# Patient Record
Sex: Female | Born: 1970
Health system: Southern US, Community
[De-identification: ages and names within clinical notes are randomized; demographics above are authoritative.]

## PROBLEM LIST (undated history)

## (undated) DIAGNOSIS — E079 Disorder of thyroid, unspecified: Secondary | ICD-10-CM

## (undated) DIAGNOSIS — D649 Anemia, unspecified: Secondary | ICD-10-CM

## (undated) HISTORY — PX: WISDOM TOOTH EXTRACTION: SHX21

## (undated) HISTORY — DX: Anemia, unspecified: D64.9

## (undated) HISTORY — DX: Disorder of thyroid, unspecified: E07.9

---

## 2001-11-11 ENCOUNTER — Other Ambulatory Visit: Admission: RE | Admit: 2001-11-11 | Discharge: 2001-11-11 | Payer: Self-pay | Admitting: *Deleted

## 2004-05-17 ENCOUNTER — Other Ambulatory Visit: Admission: RE | Admit: 2004-05-17 | Discharge: 2004-05-17 | Payer: Self-pay | Admitting: Obstetrics and Gynecology

## 2004-08-14 ENCOUNTER — Ambulatory Visit: Admission: RE | Admit: 2004-08-14 | Discharge: 2004-08-14 | Payer: Self-pay | Admitting: Internal Medicine

## 2004-08-29 ENCOUNTER — Ambulatory Visit: Payer: Self-pay | Admitting: Critical Care Medicine

## 2005-06-01 ENCOUNTER — Other Ambulatory Visit: Admission: RE | Admit: 2005-06-01 | Discharge: 2005-06-01 | Payer: Self-pay | Admitting: Obstetrics and Gynecology

## 2006-03-15 ENCOUNTER — Inpatient Hospital Stay (HOSPITAL_COMMUNITY): Admission: AD | Admit: 2006-03-15 | Discharge: 2006-03-17 | Payer: Self-pay | Admitting: Obstetrics and Gynecology

## 2006-03-18 ENCOUNTER — Encounter: Admission: RE | Admit: 2006-03-18 | Discharge: 2006-04-17 | Payer: Self-pay | Admitting: Obstetrics and Gynecology

## 2006-04-18 ENCOUNTER — Encounter: Admission: RE | Admit: 2006-04-18 | Discharge: 2006-05-17 | Payer: Self-pay | Admitting: Obstetrics and Gynecology

## 2010-10-18 ENCOUNTER — Inpatient Hospital Stay (HOSPITAL_COMMUNITY)
Admission: AD | Admit: 2010-10-18 | Discharge: 2010-10-20 | Payer: Self-pay | Source: Home / Self Care | Attending: Obstetrics and Gynecology | Admitting: Obstetrics and Gynecology

## 2010-10-20 ENCOUNTER — Encounter
Admission: RE | Admit: 2010-10-20 | Discharge: 2010-11-19 | Payer: Self-pay | Source: Home / Self Care | Attending: Obstetrics and Gynecology | Admitting: Obstetrics and Gynecology

## 2010-11-20 ENCOUNTER — Encounter
Admission: RE | Admit: 2010-11-20 | Discharge: 2010-11-21 | Payer: Self-pay | Source: Home / Self Care | Attending: Obstetrics and Gynecology | Admitting: Obstetrics and Gynecology

## 2010-12-21 ENCOUNTER — Encounter (HOSPITAL_COMMUNITY)
Admission: RE | Admit: 2010-12-21 | Discharge: 2010-12-21 | Disposition: A | Discharge status: Home / Self Care | Payer: 59 | Source: Ambulatory Visit | Attending: Obstetrics and Gynecology | Admitting: Obstetrics and Gynecology

## 2010-12-21 DIAGNOSIS — O923 Agalactia: Secondary | ICD-10-CM | POA: Insufficient documentation

## 2011-01-01 LAB — CBC
HCT: 30.7 % — ABNORMAL LOW (ref 36.0–46.0)
HCT: 32.6 % — ABNORMAL LOW (ref 36.0–46.0)
HCT: 38.6 % (ref 36.0–46.0)
Hemoglobin: 10.6 g/dL — ABNORMAL LOW (ref 12.0–15.0)
Hemoglobin: 13.1 g/dL (ref 12.0–15.0)
MCH: 28.7 pg (ref 26.0–34.0)
MCH: 28.8 pg (ref 26.0–34.0)
MCHC: 32.5 g/dL (ref 30.0–36.0)
MCHC: 33.9 g/dL (ref 30.0–36.0)
MCV: 87.3 fL (ref 78.0–100.0)
MCV: 88.3 fL (ref 78.0–100.0)
MCV: 88.5 fL (ref 78.0–100.0)
Platelets: 126 10*3/uL — ABNORMAL LOW (ref 150–400)
RBC: 3.47 MIL/uL — ABNORMAL LOW (ref 3.87–5.11)
RDW: 13.2 % (ref 11.5–15.5)
RDW: 13.4 % (ref 11.5–15.5)
WBC: 10.4 10*3/uL (ref 4.0–10.5)
WBC: 11 10*3/uL — ABNORMAL HIGH (ref 4.0–10.5)

## 2011-01-21 ENCOUNTER — Encounter (HOSPITAL_COMMUNITY)
Admission: RE | Admit: 2011-01-21 | Discharge: 2011-01-21 | Disposition: A | Discharge status: Home / Self Care | Payer: 59 | Source: Ambulatory Visit | Attending: Obstetrics and Gynecology | Admitting: Obstetrics and Gynecology

## 2011-01-21 DIAGNOSIS — O923 Agalactia: Secondary | ICD-10-CM | POA: Insufficient documentation

## 2013-03-03 ENCOUNTER — Ambulatory Visit: Payer: Self-pay | Admitting: Family Medicine

## 2013-03-03 ENCOUNTER — Ambulatory Visit: Payer: 59

## 2013-03-03 DIAGNOSIS — M542 Cervicalgia: Secondary | ICD-10-CM

## 2013-03-03 DIAGNOSIS — S139XXA Sprain of joints and ligaments of unspecified parts of neck, initial encounter: Secondary | ICD-10-CM

## 2013-03-03 DIAGNOSIS — S161XXA Strain of muscle, fascia and tendon at neck level, initial encounter: Secondary | ICD-10-CM

## 2013-03-03 MED ORDER — MELOXICAM 15 MG PO TABS: 15.0000 mg | ORAL_TABLET | Freq: Every day | ORAL | Status: DC

## 2013-03-03 MED ORDER — CYCLOBENZAPRINE HCL 10 MG PO TABS: 10.0000 mg | ORAL_TABLET | Freq: Three times a day (TID) | ORAL | Status: DC | PRN
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
## 2013-03-03 NOTE — Patient Instructions (Addendum)
Begin taking the Mobic (meloxicam) once daily - this will help with pain and inflammation.  Do not take any additional Advil or Aleve with this medicine,  If you need further pain relief, you may use Tylenol.  Use Flexeril (cyclobenzaprine) at bedtime - this will help relax the muscle spasm.  This may make you sleepy, so try at night only at first.  If it is not too sedating, you may use every 8 hours.  Ice or heat to the area may also help relieve discomfort.  If any symptoms are worsening or not improving please let us know.  If the chest pain is worsening please come back in or go to the emergency department   Cervical Strain Care After A cervical strain is when the muscles and ligaments in your neck have been stretched. The bones are not broken. If you had any problems moving your arms or legs immediately after the injury, even if the problem has gone away, make sure to tell this to your caregiver.  HOME CARE INSTRUCTIONS   While awake, apply ice packs to the neck or areas of pain about every 1 to 2 hours, for 15 to 20 minutes at a time. Do this for 2 days. If you were given a cervical collar for support, ask your caregiver if you may remove it for bathing or applying ice.   If given a cervical collar, wear as instructed. Do not remove any collar unless instructed by a caregiver.   Only take over-the-counter or prescription medicines for pain, discomfort, or fever as directed by your caregiver.  Recheck with the hospital or clinic after a radiologist has read your X-rays. Recheck with the hospital or clinic to make sure the initial readings are correct. Do this also to determine if you need further studies. It is your responsibility to find out your X-ray results. X-rays are sometimes repeated in one week to ten days. These are often repeated to make sure that a hairline fracture was not overlooked. Ask your caregiver how you are to find out about your radiology (X-ray) results. SEEK  IMMEDIATE MEDICAL CARE IF:   You have increasing pain in your neck.   You develop difficulties swallowing or breathing.   You have numbness, weakness, or movement problems in the arms or legs.   You have difficulty walking.   You develop bowel or bladder retention or incontinence.   You have problems with walking.  MAKE SURE YOU:   Understand these instructions.   Will watch your condition.   Will get help right away if you are not doing well or get worse.  Document Released: 10/08/2005 Document Revised: 06/20/2011 Document Reviewed: 05/21/2008 [facility] Patient Information 2012 [place], [place].

## 2013-03-03 NOTE — Progress Notes (Signed)
Xray read and patient discussed with Ms Debbra Riding. Agree with assessment and plan of care per her note.  Xray report:IMPRESSION:  1. No definite acute findings. If concern persists for an occult  acute injury to the cervical spine, further evaluation with  cervical spine CT is recommended.  2. Mild to moderate multilevel cervical spine DDD, worse at C6 - C7.

## 2013-08-27 ENCOUNTER — Other Ambulatory Visit: Payer: Self-pay

## 2015-09-08 ENCOUNTER — Encounter: Payer: Self-pay | Admitting: Physician Assistant

## 2015-09-08 ENCOUNTER — Ambulatory Visit: Payer: Self-pay | Admitting: Family

## 2015-09-08 VITALS — BP 100/70 | HR 87 | Temp 98.1°F

## 2015-09-08 DIAGNOSIS — R05 Cough: Secondary | ICD-10-CM

## 2015-09-08 DIAGNOSIS — R059 Cough, unspecified: Secondary | ICD-10-CM

## 2015-09-08 DIAGNOSIS — J40 Bronchitis, not specified as acute or chronic: Secondary | ICD-10-CM

## 2015-09-08 MED ORDER — LEVOFLOXACIN 500 MG PO TABS: 500.0000 mg | ORAL_TABLET | Freq: Every day | ORAL | Status: DC

## 2015-09-08 MED ORDER — PREDNISONE 20 MG PO TABS: 40.0000 mg | ORAL_TABLET | Freq: Every day | ORAL | Status: DC

## 2015-09-08 MED ORDER — HYDROCOD POLST-CPM POLST ER 10-8 MG/5ML PO SUER: 5.0000 mL | Freq: Two times a day (BID) | ORAL | Status: DC | PRN

## 2015-09-08 NOTE — Progress Notes (Signed)
S/ cold and cough x one week , tightness in chest , sore in shoulder blade from coughing Denies fever, chills , gi sxs  O/ VSS alert mildly ill  ENT tms retracted, pharnx clear, nasal mucosa red and swollen , neck supple , heart rsr lungs are clear  A bronchitis cough P levaquin 500 mg  , pred pulse, tussionex see order s .supportive measures rest at home. F./u prn not improving .

## 2015-12-23 ENCOUNTER — Encounter: Payer: Self-pay | Admitting: Physician Assistant

## 2015-12-23 ENCOUNTER — Ambulatory Visit: Payer: Self-pay | Admitting: Physician Assistant

## 2015-12-23 VITALS — BP 110/70 | HR 81 | Temp 98.5°F

## 2015-12-23 DIAGNOSIS — E038 Other specified hypothyroidism: Secondary | ICD-10-CM

## 2015-12-23 NOTE — Progress Notes (Signed)
   Subjective:    Patient ID: Aimee Cole, female    DOB: [birth year], 45 y.o.   MRN: [id number]  HPI Patient request prescription for levothyroxine due to elevate TSH. Patient states Family Doctor office told her labs were normal. Patient has copy of Lab results from this clinic showing elevate TSH.   Review of Systems    Maliase and depression Objective   Physical Exam Deferred Will consult to Internal Medicine for definitive evaluation and treatment.      Assessment & Plan:

## 2016-02-14 ENCOUNTER — Ambulatory Visit: Payer: Self-pay | Admitting: Physician Assistant

## 2016-02-14 ENCOUNTER — Encounter: Payer: Self-pay | Admitting: Physician Assistant

## 2016-02-14 VITALS — BP 119/75 | HR 107 | Temp 98.4°F

## 2016-02-14 DIAGNOSIS — J069 Acute upper respiratory infection, unspecified: Secondary | ICD-10-CM

## 2016-02-14 MED ORDER — AMOXICILLIN 875 MG PO TABS: 875.0000 mg | ORAL_TABLET | Freq: Two times a day (BID) | ORAL | Status: DC

## 2016-02-14 NOTE — Progress Notes (Signed)
S: C/o runny nose and congestion for 3 days, no fever, chills, cp/sob, v/d; throat is sore,  cough is sporadic and dry, some body aches   Using otc meds: mucinex  O: PE: vitals wnl, nad, perrl eomi, normocephalic, tms dull, nasal mucosa red and swollen, throat injected, neck supple no lymph, lungs c t a, cv rrr, neuro intact  A:  Acute uri   P: amoxil 875mg  bid x 10d, drink fluids, continue regular meds , use otc meds of choice, return if not improving in 5 days, return earlier if worsening

## 2016-05-08 ENCOUNTER — Telehealth: Payer: Self-pay | Admitting: General Practice

## 2016-05-08 ENCOUNTER — Ambulatory Visit (INDEPENDENT_AMBULATORY_CARE_PROVIDER_SITE_OTHER): Payer: Managed Care, Other (non HMO) | Admitting: Primary Care

## 2016-05-08 ENCOUNTER — Ambulatory Visit: Payer: Self-pay | Admitting: Physician Assistant

## 2016-05-08 ENCOUNTER — Encounter: Payer: Self-pay | Admitting: Primary Care

## 2016-05-08 VITALS — BP 144/92 | HR 87 | Temp 98.1°F | Ht 66.0 in | Wt 190.0 lb

## 2016-05-08 DIAGNOSIS — R42 Dizziness and giddiness: Secondary | ICD-10-CM

## 2016-05-08 DIAGNOSIS — R030 Elevated blood-pressure reading, without diagnosis of hypertension: Secondary | ICD-10-CM | POA: Insufficient documentation

## 2016-05-08 DIAGNOSIS — E039 Hypothyroidism, unspecified: Secondary | ICD-10-CM

## 2016-05-08 DIAGNOSIS — F411 Generalized anxiety disorder: Secondary | ICD-10-CM | POA: Insufficient documentation

## 2016-05-08 LAB — COMPREHENSIVE METABOLIC PANEL
ALBUMIN: 4.2 g/dL (ref 3.5–5.2)
ALK PHOS: 59 U/L (ref 39–117)
ALT: 13 U/L (ref 0–35)
AST: 19 U/L (ref 0–37)
BUN: 12 mg/dL (ref 6–23)
CALCIUM: 9.4 mg/dL (ref 8.4–10.5)
CO2: 30 mEq/L (ref 19–32)
CREATININE: 0.88 mg/dL (ref 0.40–1.20)
Chloride: 105 mEq/L (ref 96–112)
GFR: 74.3 mL/min (ref >60.00)
Glucose, Bld: 104 mg/dL — ABNORMAL HIGH (ref 70–99)
POTASSIUM: 4 meq/L (ref 3.5–5.1)
SODIUM: 139 meq/L (ref 135–145)
TOTAL PROTEIN: 7.2 g/dL (ref 6.0–8.3)
Total Bilirubin: 0.4 mg/dL (ref 0.2–1.2)

## 2016-05-08 LAB — HEMOGLOBIN A1C: HEMOGLOBIN A1C: 4.9 % (ref 4.6–6.5)

## 2016-05-08 MED ORDER — ONDANSETRON 4 MG PO TBDP: 4.0000 mg | ORAL_TABLET | Freq: Three times a day (TID) | ORAL | Status: DC | PRN

## 2016-05-08 MED ORDER — MECLIZINE HCL 25 MG PO TABS: 25.0000 mg | ORAL_TABLET | Freq: Three times a day (TID) | ORAL | Status: DC | PRN

## 2016-05-08 NOTE — Telephone Encounter (Signed)
Pt called  - she is seeing you for a new pt on Thursday. Today she is having dizziness and blurry vision.  Do you want me to schedule her into an acute, have her talk to team health or something different?  cb number is [phone] Thanks

## 2016-05-08 NOTE — Telephone Encounter (Signed)
Pt was made a new pt appt for today at 11:45am due to a canc

## 2016-05-08 NOTE — Assessment & Plan Note (Signed)
Follows with psychiatry, Dr. Nicolasa Ducking. Follow-up appointment scheduled for later this week. Uses clonazepam only as needed for breakthrough panic attacks or anxiety. Overall feels well managed.

## 2016-05-08 NOTE — Assessment & Plan Note (Signed)
Follows with endocrinology, Dr. Della Goo with [facility]. Managed on Armour Thyroid 30 mg and is due for follow-up with endocrinologist today.

## 2016-05-08 NOTE — Assessment & Plan Note (Signed)
No prior history per patient. Suspect elevation due to acute dizziness and headache. Given family history we'll continue to monitor.

## 2016-05-08 NOTE — Progress Notes (Signed)
Subjective:    Patient ID: Aimee Cole, female    DOB: 1971/01/14, 45 y.o.   MRN: [id number]  HPI  Aimee Cole is a 45 year old female who presents today to establish care and discuss the problems mentioned below. Will obtain old records. Her last physical was over 1 year.   1) Elevated Blood Pressure Reading: BP above goal in the clinic today at 144/92. She denies prior history of elevated blood pressure. She's had her BP checked recently which was stable per patient. Her blood pressure typically runs between 110-120/70's. She is here for an acute visit today for dizziness.  2) Hypothyroidism: Diagnosed 2 years ago. Currently managed on Amrour Thyroid 30 mg for the past 2 months. She was previously managed on levothyroxine which did not help to reduce her symptoms . She had her TSH rechecked last week and is due for follow-up today with her endocrinologist.   3) Generalized Anxiety Disorder: Diagnosed several years ago. She currently follows with Dr. Nicolasa Ducking (psychiatry) and once managed on Zoloft. She stopped taking her Zoloft 1 month ago as she felt her anxiety to be improved. She is currently managed on Clonazepam that she takes PRN.Marland Kitchen She has an upcoming appointment with Dr. Nicolasa Ducking later this week.   4) Dizziness: Present since yesterday morning. She woke up yesterday with a headache, took some ibuprofen with improvement. She then started to experience dizziness later that afternoon, but felt better after eating lunch. She started to experience dizziness later that evening while watching TV. She saw waviness on the TV screen and then developed nausea with 2 episodes of vomiting.   She then got up to use the bathroom later last night and felt off balance with a "swaying" feeling when walking. She describes her vision and dizziness as "shuttering/swaying". This morning she developed pain to her right chest wall pain with radiation through her back. She vomited again today in the clinic prior to  her appointment.  Denies fevers, abdominal pain, diarrhea. She has no headache at the moment. She has noticed esophageal burning over the last 24 hours.   Review of Systems  Constitutional: Negative for fever and chills.  HENT: Negative for congestion, rhinorrhea and sore throat.   Eyes: Positive for photophobia.  Respiratory: Negative for shortness of breath.   Cardiovascular: Positive for chest pain.  Gastrointestinal: Positive for nausea and vomiting. Negative for abdominal pain and diarrhea.  Musculoskeletal: Negative for myalgias and arthralgias.  Allergic/Immunologic: Negative for environmental allergies.  Neurological: Positive for dizziness and headaches.  Psychiatric/Behavioral:       Overall feels anxiety well managed.       No past medical history on file.   Social History   Social History  . Marital Status: Married    Spouse Name: N/A  . Number of Children: N/A  . Years of Education: N/A   Occupational History  . Not on file.   Social History Main Topics  . Smoking status: Former Research scientist (life sciences)  . Smokeless tobacco: Not on file  . Alcohol Use: No  . Drug Use: No  . Sexual Activity: Yes    Birth Control/ Protection: Pill   Other Topics Concern  . Not on file   Social History Narrative    No past surgical history on file.  Family History  Problem Relation Age of Onset  . Hypertension Father   . Diabetes Paternal Grandfather   . Hyperlipidemia Father   . Heart disease Father   . Uterine cancer  Mother   . Breast cancer Maternal Aunt   . Pancreatic cancer Maternal Grandmother   . Colon cancer Maternal Grandfather     No Known Allergies  No current outpatient prescriptions on file prior to visit.   No current facility-administered medications on file prior to visit.    BP 144/92 mmHg  Pulse 87  Temp(Src) 98.1 F (36.7 C) (Oral)  Ht 5\' 6"  (1.676 m)  Wt 190 lb (86.183 kg)  BMI 30.68 kg/m2  SpO2 98%  LMP 05/05/2016    Objective:   Physical  Exam  Constitutional: She appears well-nourished. She does not appear ill.  Appears uncomfortable due to dizziness and nausea.  HENT:  Right Ear: Tympanic membrane and ear canal normal.  Left Ear: Tympanic membrane and ear canal normal.  Nose: Right sinus exhibits no maxillary sinus tenderness and no frontal sinus tenderness. Left sinus exhibits no maxillary sinus tenderness and no frontal sinus tenderness.  Mouth/Throat: Oropharynx is clear and moist.  Eyes: Conjunctivae and EOM are normal. Pupils are equal, round, and reactive to light.  Neck: Neck supple.  Cardiovascular: Normal rate and regular rhythm.   Pulmonary/Chest: Effort normal and breath sounds normal. She has no wheezes. She has no rales.  Abdominal: Soft. Bowel sounds are normal. There is no tenderness.  Lymphadenopathy:    She has no cervical adenopathy.  Neurological: No cranial nerve deficit.  Skin: Skin is warm and dry.  Psychiatric: She has a normal mood and affect.          Assessment & Plan:  Dizziness:  Since yesterday afternoon after experiencing a headache when waking. Overall headache has been more intermittent and less bothersome. Continues to express dizziness with nausea and several episodes of vomiting. No signs or symptoms of acute infection or GI involvement. Neuro exam unremarkable. ENT exam unremarkable.. Suspect dizziness related to vertigo or mild migraine. EKG unremarkable. Normal sinus rhythm, no ST elevation or depression, T-wave inversion. Since she is without headache today will treat for vertigo. Prescription for meclizine and Zofran sent to pharmacy. Also take ibuprofen as needed for headache. Explained course of vertigo and that symptoms should improve within the next several days. Will obtain CBC and CMP to rule out any other causes. She will notify me if no improvement in dizziness within the next 2-3 days.  Sheral Flow, NP

## 2016-05-08 NOTE — Patient Instructions (Addendum)
Start Meclizine tablets for dizziness. Take 1 tablet by mouth 2-3 times daily as needed for dizziness.   You may take Zofran (ondansetron) as needed for nausea. Melt 1 tablet by mouth every 8 hours as needed for nausea/vomiting.  Continue ibuprofen as needed for headaches. Take 600 mg three times daily as needed.  Complete lab work prior to leaving today. I will notify you of your results once received.   Your symptoms should gradually improve over the next several days. Please call me if no improvement as discussed.  Please schedule a physical with me in the near future at your convenience. You may also schedule a lab only appointment 3-4 days prior. We will discuss your lab results in detail during your physical.  It was a pleasure to meet you today! Please don't hesitate to call me with any questions. Welcome to [facility]!  Vertigo Vertigo means you feel like you or your surroundings are moving when they are not. Vertigo can be dangerous if it occurs when you are at work, driving, or performing difficult activities.  CAUSES  Vertigo occurs when there is a conflict of signals sent to your brain from the visual and sensory systems in your body. There are many different causes of vertigo, including:  Infections, especially in the inner ear.  A bad reaction to a drug or misuse of alcohol and medicines.  Withdrawal from drugs or alcohol.  Rapidly changing positions, such as lying down or rolling over in bed.  A migraine headache.  Decreased blood flow to the brain.  Increased pressure in the brain from a head injury, infection, tumor, or bleeding. SYMPTOMS  You may feel as though the world is spinning around or you are falling to the ground. Because your balance is upset, vertigo can cause nausea and vomiting. You may have involuntary eye movements (nystagmus). DIAGNOSIS  Vertigo is usually diagnosed by physical exam. If the cause of your vertigo is unknown, your caregiver may  perform imaging tests, such as an MRI scan (magnetic resonance imaging). TREATMENT  Most cases of vertigo resolve on their own, without treatment. Depending on the cause, your caregiver may prescribe certain medicines. If your vertigo is related to body position issues, your caregiver may recommend movements or procedures to correct the problem. In rare cases, if your vertigo is caused by certain inner ear problems, you may need surgery. HOME CARE INSTRUCTIONS   Follow your caregiver's instructions.  Avoid driving.  Avoid operating heavy machinery.  Avoid performing any tasks that would be dangerous to you or others during a vertigo episode.  Tell your caregiver if you notice that certain medicines seem to be causing your vertigo. Some of the medicines used to treat vertigo episodes can actually make them worse in some people. SEEK IMMEDIATE MEDICAL CARE IF:   Your medicines do not relieve your vertigo or are making it worse.  You develop problems with talking, walking, weakness, or using your arms, hands, or legs.  You develop severe headaches.  Your nausea or vomiting continues or gets worse.  You develop visual changes.  A family member notices behavioral changes.  Your condition gets worse. MAKE SURE YOU:  Understand these instructions.  Will watch your condition.  Will get help right away if you are not doing well or get worse.   This information is not intended to replace advice given to you by your health care provider. Make sure you discuss any questions you have with your health care provider.  Document Released: 07/18/2005 Document Revised: 12/31/2011 Document Reviewed: 01/31/2015 Elsevier Interactive Patient Education [employer].

## 2016-05-08 NOTE — Progress Notes (Signed)
Pre visit review using our clinic review tool, if applicable. No additional management support is needed unless otherwise documented below in the visit note. 

## 2016-05-08 NOTE — Telephone Encounter (Signed)
My schedule is completely full today. I would advise sending her to team health and/or changing her new patient appointment to a 30 minute slot tomorrow.

## 2016-05-09 LAB — CBC
HEMATOCRIT: 38.8 % (ref 36.0–46.0)
Hemoglobin: 13 g/dL (ref 12.0–15.0)
MCHC: 33.6 g/dL (ref 30.0–36.0)
MCV: 84.4 fl (ref 78.0–100.0)
PLATELETS: 233 10*3/uL (ref 150.0–400.0)
RBC: 4.59 Mil/uL (ref 3.87–5.11)
RDW: 13.8 % (ref 11.5–15.5)
WBC: 8.5 10*3/uL (ref 4.0–10.5)

## 2016-05-10 ENCOUNTER — Ambulatory Visit: Payer: Self-pay | Admitting: Primary Care

## 2016-07-11 ENCOUNTER — Ambulatory Visit: Payer: Self-pay | Admitting: Physician Assistant

## 2016-07-11 ENCOUNTER — Encounter: Payer: Self-pay | Admitting: Physician Assistant

## 2016-07-11 VITALS — BP 120/70 | HR 80 | Temp 98.3°F

## 2016-07-11 DIAGNOSIS — M5442 Lumbago with sciatica, left side: Secondary | ICD-10-CM

## 2016-07-11 DIAGNOSIS — J0100 Acute maxillary sinusitis, unspecified: Secondary | ICD-10-CM

## 2016-07-11 MED ORDER — METHYLPREDNISOLONE 4 MG PO TBPK: ORAL_TABLET | ORAL | 0 refills | Status: DC

## 2016-07-11 MED ORDER — FLUTICASONE PROPIONATE 50 MCG/ACT NA SUSP
2.0000 | Freq: Every day | NASAL | 6 refills | Status: DC
Start: 2016-07-11 — End: 2017-02-27

## 2016-07-11 MED ORDER — BACLOFEN 10 MG PO TABS: 10.0000 mg | ORAL_TABLET | Freq: Three times a day (TID) | ORAL | 0 refills | Status: DC

## 2016-07-11 MED ORDER — CEFDINIR 300 MG PO CAPS: 300.0000 mg | ORAL_CAPSULE | Freq: Two times a day (BID) | ORAL | 0 refills | Status: DC

## 2016-07-11 NOTE — Progress Notes (Signed)
S: C/o runny nose and congestion for 3 -4 days, no fever, chills, cp/sob, v/d; mucus is green and thick, cough is sporadic, c/o of facial and dental pain. Head is really congested, also c/o left sided sciatic pain, states the coughing has set her back off, no numbness or tingling, pain radiates to just above knee, hx of the same and had to have cortisone injections in her back  Using otc meds:   O: PE: vitals wnl, nad, perrl eomi, normocephalic, tms dull, nasal mucosa red and swollen, throat injected, neck supple no lymph, lungs c t a, cv rrr, neuro intact  A:  Acute sinusitis, acute sciatica   P: drink fluids, continue regular meds , use otc meds of choice, return if not improving in 5 days, return earlier if worsening  Omnicef, flonase, medrol dose pack, baclofen

## 2016-07-24 ENCOUNTER — Encounter: Payer: Self-pay | Admitting: Physician Assistant

## 2016-07-24 ENCOUNTER — Ambulatory Visit: Payer: Self-pay | Admitting: Physician Assistant

## 2016-07-24 VITALS — BP 110/70 | HR 88 | Temp 98.3°F

## 2016-07-24 DIAGNOSIS — R05 Cough: Secondary | ICD-10-CM

## 2016-07-24 DIAGNOSIS — R059 Cough, unspecified: Secondary | ICD-10-CM

## 2016-07-24 DIAGNOSIS — R509 Fever, unspecified: Secondary | ICD-10-CM

## 2016-07-24 LAB — POCT INFLUENZA A/B
Influenza A, POC: NEGATIVE
Influenza B, POC: NEGATIVE

## 2016-07-24 MED ORDER — ALBUTEROL SULFATE (2.5 MG/3ML) 0.083% IN NEBU: 2.5000 mg | INHALATION_SOLUTION | Freq: Four times a day (QID) | RESPIRATORY_TRACT | 1 refills | Status: DC | PRN

## 2016-07-24 MED ORDER — METHYLPREDNISOLONE 4 MG PO TBPK: ORAL_TABLET | ORAL | 0 refills | Status: DC

## 2016-07-24 NOTE — Progress Notes (Signed)
S: C/o runny nose and congestion with dry cough for 3 days, + fever, chills last night but none today, had body aches yesterday, denies cp/sob, v/d; cough is sporadic, harsh, felt better after finishing the omnicef and steroid last week , but sx suddenly returned, worried as she had pneumonia last year  Using otc meds:   O: PE: vitals wnl, nad,  perrl eomi, normocephalic, tms dull, nasal mucosa red and swollen, throat injected, neck supple no lymph, lungs c t a, cv rrr, neuro intact, cough is dry and bronchial;  flu swab neg  A:  Acute bronchitis   P: drink fluids, continue regular meds , use otc meds of choice, return if not improving in 5 days, return earlier if worsening , medrol dose pack, albuterol inhaler, if worsening call office and will call in an antibiotic

## 2016-10-08 ENCOUNTER — Encounter: Payer: Self-pay | Admitting: Primary Care

## 2016-10-08 ENCOUNTER — Ambulatory Visit (INDEPENDENT_AMBULATORY_CARE_PROVIDER_SITE_OTHER): Payer: Managed Care, Other (non HMO) | Admitting: Primary Care

## 2016-10-08 VITALS — BP 116/74 | HR 76 | Temp 98.0°F | Ht 66.0 in | Wt 197.0 lb

## 2016-10-08 DIAGNOSIS — E039 Hypothyroidism, unspecified: Secondary | ICD-10-CM | POA: Diagnosis not present

## 2016-10-08 MED ORDER — THYROID 15 MG PO TABS: 15.0000 mg | ORAL_TABLET | Freq: Every day | ORAL | 1 refills | Status: DC

## 2016-10-08 NOTE — Progress Notes (Signed)
   Subjective:    Patient ID: Aimee Cole, female    DOB: Oct 20, 1971, 45 y.o.   MRN: [id number]  HPI  Aimee Cole is a 45 year old female who presents today to discuss labs. She has a history of hypothyroidism and presents today with an elevated TSH of 6.23 that was obtained from her GYN on 09/27/16. She is currently managed on Armour Thyroid 30 mg.   Since her last visit she's noticed feeling foggy, difficulty with memory, hair loss, and weight gain. She denies chest pain, palpitations. She has noticed her throat/anterior neck feeling more "full". She's been managed on the Armour Thyroid 30 mg since May 2017 without a dose change.  Review of Systems  Constitutional: Positive for fatigue.  HENT: Negative for trouble swallowing.   Cardiovascular: Negative for chest pain and palpitations.  Skin:       Hair loss  Neurological: Negative for weakness.       No past medical history on file.   Social History   Social History  . Marital status: Married    Spouse name: N/A  . Number of children: N/A  . Years of education: N/A   Occupational History  . Not on file.   Social History Main Topics  . Smoking status: Former Research scientist (life sciences)  . Smokeless tobacco: Not on file  . Alcohol use No  . Drug use: No  . Sexual activity: Yes    Birth control/ protection: Pill   Other Topics Concern  . Not on file   Social History Narrative  . No narrative on file    No past surgical history on file.  Family History  Problem Relation Age of Onset  . Hypertension Father   . Diabetes Paternal Grandfather   . Hyperlipidemia Father   . Heart disease Father   . Uterine cancer Mother   . Breast cancer Maternal Aunt   . Pancreatic cancer Maternal Grandmother   . Colon cancer Maternal Grandfather     No Known Allergies  Current Outpatient Prescriptions on File Prior to Visit  Medication Sig Dispense Refill  . ARMOUR THYROID 30 MG tablet TAKE 1 TABLET (30 MG TOTAL) BY MOUTH EVERY MORNING BEFORE  BREAKFAST (0630).  5  . albuterol (PROVENTIL) (2.5 MG/3ML) 0.083% nebulizer solution Take 3 mLs (2.5 mg total) by nebulization every 6 (six) hours as needed for wheezing or shortness of breath. (Patient not taking: Reported on 10/08/2016) 150 mL 1  . clonazePAM (KLONOPIN) 0.5 MG tablet Take 0.5 mg by mouth daily as needed for anxiety.    . fluticasone (FLONASE) 50 MCG/ACT nasal spray Place 2 sprays into both nostrils daily. (Patient not taking: Reported on 10/08/2016) 16 g 6   No current facility-administered medications on file prior to visit.     BP 116/74   Pulse 76   Temp 98 F (36.7 C) (Oral)   Ht 5\' 6"  (1.676 m)   Wt 197 lb (89.4 kg)   LMP 09/26/2016   SpO2 98%   BMI 31.80 kg/m    Objective:   Physical Exam  Constitutional: She appears well-nourished.  Neck: Neck supple. No thyromegaly present.  Cardiovascular: Normal rate and regular rhythm.   Pulmonary/Chest: Effort normal and breath sounds normal.  Skin: Skin is warm and dry.  Psychiatric: She has a normal mood and affect.          Assessment & Plan:

## 2016-10-08 NOTE — Progress Notes (Signed)
Pre visit review using our clinic review tool, if applicable. No additional management support is needed unless otherwise documented below in the visit note. 

## 2016-10-08 NOTE — Patient Instructions (Signed)
Continue taking Armour Thyroid 30 mg once daily. Also start taking Armour Thyroid 15 mg once daily.   Schedule a lab only appointment in 4 weeks for re-evaluation of your thyroid levels.  It was a pleasure to see you today! Merry Christmas!

## 2016-10-08 NOTE — Assessment & Plan Note (Addendum)
TSH of 6.2 from GYN 2 weeks ago, also with symptoms as noted in HPI. Labs scanned into chart. Will increase Armour Thyroid by 15 mg for a total dose of 45 mg. Discussed to take both the 30 mg and 15 mg.  Recheck TSH in 4 weeks.

## 2016-10-29 ENCOUNTER — Other Ambulatory Visit: Payer: Self-pay | Admitting: Primary Care

## 2016-10-29 DIAGNOSIS — E039 Hypothyroidism, unspecified: Secondary | ICD-10-CM

## 2016-11-08 ENCOUNTER — Other Ambulatory Visit: Payer: Self-pay

## 2016-11-13 ENCOUNTER — Ambulatory Visit: Payer: Self-pay | Admitting: Physician Assistant

## 2016-11-13 VITALS — BP 100/70 | HR 101 | Temp 98.4°F

## 2016-11-13 DIAGNOSIS — J069 Acute upper respiratory infection, unspecified: Secondary | ICD-10-CM

## 2016-11-13 DIAGNOSIS — B9789 Other viral agents as the cause of diseases classified elsewhere: Principal | ICD-10-CM

## 2016-11-13 NOTE — Progress Notes (Signed)
S:  Cough and chest congestion x 5-6 days.  Family members have had the flu. She feels that she had flu at that time also.    Taking OTC meds but feels like she is not able to cough up anything.  Cough is worse at night.  Non-smoker, no prior lung problems   O: TMS dull bilat.  Nose boggy, throat without redness or drainage.  Neck supple without aden.  Lungs clear bilat.    A:  Post influenza cough  P: Tussionex  61ml at hs 50 ml

## 2016-11-14 ENCOUNTER — Other Ambulatory Visit: Payer: Self-pay

## 2016-11-15 ENCOUNTER — Encounter: Payer: Self-pay | Admitting: Primary Care

## 2016-11-15 ENCOUNTER — Other Ambulatory Visit (INDEPENDENT_AMBULATORY_CARE_PROVIDER_SITE_OTHER): Payer: Managed Care, Other (non HMO)

## 2016-11-15 DIAGNOSIS — E039 Hypothyroidism, unspecified: Secondary | ICD-10-CM | POA: Diagnosis not present

## 2016-11-15 LAB — TSH: TSH: 6.72 u[IU]/mL — ABNORMAL HIGH (ref 0.35–4.50)

## 2016-11-16 ENCOUNTER — Other Ambulatory Visit: Payer: Self-pay | Admitting: Primary Care

## 2016-11-16 DIAGNOSIS — E039 Hypothyroidism, unspecified: Secondary | ICD-10-CM

## 2016-11-16 MED ORDER — THYROID 60 MG PO TABS: 60.0000 mg | ORAL_TABLET | Freq: Every day | ORAL | 0 refills | Status: DC

## 2016-11-30 ENCOUNTER — Encounter: Payer: Self-pay | Admitting: *Deleted

## 2016-12-09 ENCOUNTER — Other Ambulatory Visit: Payer: Self-pay | Admitting: Primary Care

## 2016-12-09 DIAGNOSIS — E039 Hypothyroidism, unspecified: Secondary | ICD-10-CM

## 2016-12-13 ENCOUNTER — Other Ambulatory Visit (INDEPENDENT_AMBULATORY_CARE_PROVIDER_SITE_OTHER): Payer: Managed Care, Other (non HMO)

## 2016-12-13 DIAGNOSIS — E039 Hypothyroidism, unspecified: Secondary | ICD-10-CM

## 2016-12-14 LAB — TSH: TSH: 4.28 u[IU]/mL (ref 0.35–4.50)

## 2017-02-25 ENCOUNTER — Other Ambulatory Visit: Payer: Self-pay | Admitting: Primary Care

## 2017-02-25 DIAGNOSIS — E039 Hypothyroidism, unspecified: Secondary | ICD-10-CM

## 2017-02-27 ENCOUNTER — Encounter: Payer: Self-pay | Admitting: Primary Care

## 2017-02-27 ENCOUNTER — Ambulatory Visit (INDEPENDENT_AMBULATORY_CARE_PROVIDER_SITE_OTHER): Payer: Managed Care, Other (non HMO) | Admitting: Primary Care

## 2017-02-27 VITALS — BP 110/80 | HR 73 | Temp 98.4°F | Ht 66.0 in | Wt 193.5 lb

## 2017-02-27 DIAGNOSIS — D229 Melanocytic nevi, unspecified: Secondary | ICD-10-CM

## 2017-02-27 NOTE — Progress Notes (Signed)
Pre visit review using our clinic review tool, if applicable. No additional management support is needed unless otherwise documented below in the visit note. 

## 2017-02-27 NOTE — Patient Instructions (Signed)
The mole on the back of your leg is benign.  Watch out for changes in shape and size. Please notify me if you notice changes in color such as red, blue, black.  Please notify me if you'd like to have this removed. I don't recommend it at this point.  It was a pleasure to see you today!

## 2017-02-27 NOTE — Progress Notes (Signed)
   Subjective:    Patient ID: Aimee Cole, female    DOB: Mar 31, 1971, 46 y.o.   MRN: [id number]  HPI  Aimee Cole is a 46 year old female who presents today with a chief complaint of skin neuvs. The nevus is located to the left calf that has been present for the past 6 years. She denies pain, changes in size, changes in color. The nevus will get in the way when shaving as she's accidentally cut it while shaving.  Review of Systems  Constitutional: Negative for fever.  Skin: Negative for rash and wound.       Skin mass       No past medical history on file.   Social History   Social History  . Marital status: Married    Spouse name: N/A  . Number of children: N/A  . Years of education: N/A   Occupational History  . Not on file.   Social History Main Topics  . Smoking status: Former Research scientist (life sciences)  . Smokeless tobacco: Never Used  . Alcohol use No  . Drug use: No  . Sexual activity: Yes    Birth control/ protection: Pill   Other Topics Concern  . Not on file   Social History Narrative  . No narrative on file    No past surgical history on file.  Family History  Problem Relation Age of Onset  . Hypertension Father   . Diabetes Paternal Grandfather   . Hyperlipidemia Father   . Heart disease Father   . Uterine cancer Mother   . Breast cancer Maternal Aunt   . Pancreatic cancer Maternal Grandmother   . Colon cancer Maternal Grandfather     No Known Allergies  Current Outpatient Prescriptions on File Prior to Visit  Medication Sig Dispense Refill  . ARMOUR THYROID 60 MG tablet TAKE 1 TABLET (60 MG TOTAL) BY MOUTH DAILY BEFORE BREAKFAST. 90 tablet 1   No current facility-administered medications on file prior to visit.     BP 110/80   Pulse 73   Temp 98.4 F (36.9 C) (Oral)   Ht 5\' 6"  (1.676 m)   Wt 193 lb 8 oz (87.8 kg)   LMP 02/08/2017   BMI 31.23 kg/m    Objective:   Physical Exam  Constitutional: She appears well-nourished.  Neck: Neck supple.    Cardiovascular: Normal rate.   Pulmonary/Chest: Effort normal.  Skin: Skin is warm and dry.  1 cm circular, mildly raised, light brown nevus to left calf.          Assessment & Plan:  Nevus:  Located to left calf for years.  Overall not bothersome but does occasionally nick when shaving. Exam today with small, mildly raised nevus. Benign appearing. Discussed that I would not recommend removal as it may cause scaring, but did offer to remove it if she wished. She will think about this and notify if she'd like to proceed.  Sheral Flow, NP

## 2017-05-09 ENCOUNTER — Other Ambulatory Visit: Payer: Self-pay | Admitting: Primary Care

## 2017-05-09 DIAGNOSIS — E039 Hypothyroidism, unspecified: Secondary | ICD-10-CM

## 2017-05-10 ENCOUNTER — Other Ambulatory Visit (INDEPENDENT_AMBULATORY_CARE_PROVIDER_SITE_OTHER): Payer: Managed Care, Other (non HMO)

## 2017-05-10 DIAGNOSIS — E039 Hypothyroidism, unspecified: Secondary | ICD-10-CM

## 2017-05-10 LAB — TSH: TSH: 7.68 u[IU]/mL — ABNORMAL HIGH (ref 0.35–4.50)

## 2017-05-15 ENCOUNTER — Encounter: Payer: Self-pay | Admitting: Primary Care

## 2017-05-15 ENCOUNTER — Ambulatory Visit (INDEPENDENT_AMBULATORY_CARE_PROVIDER_SITE_OTHER): Payer: Managed Care, Other (non HMO) | Admitting: Primary Care

## 2017-05-15 VITALS — BP 130/86 | HR 94 | Temp 98.1°F | Ht 66.0 in | Wt 194.8 lb

## 2017-05-15 DIAGNOSIS — E039 Hypothyroidism, unspecified: Secondary | ICD-10-CM

## 2017-05-15 MED ORDER — THYROID 90 MG PO TABS: ORAL_TABLET | ORAL | 1 refills | Status: DC

## 2017-05-15 NOTE — Progress Notes (Signed)
   Subjective:    Patient ID: Aimee Cole, female    DOB: 1971-04-14, 46 y.o.   MRN: [id number]  HPI  Aimee Cole is a 46 year old female who presents today for follow up of hypothyroidism. She is currently managed on Armour thyorid 60 mg. Her most recent TSH was elevated at 7.68, prior TSH was normal in February 2018.    She's feeling symptoms difficulty remembering things, stiffness to her lower extremities, increased fatigue, intermittent dizziness, some hair loss that is more noticeable. These symptoms began about 1-2 months ago. She origionally thought her symptoms were related to stress, but hasn't felt stress. She also though her stiffness was related to her shoes so she switched shoes several times without improvement.  Review of Systems  Constitutional: Positive for fatigue.  Respiratory: Negative for shortness of breath.   Cardiovascular: Negative for chest pain and palpitations.  Endocrine: Negative for cold intolerance.  Musculoskeletal: Positive for arthralgias.  Skin:       Some hair loss  Neurological: Positive for dizziness.       No past medical history on file.   Social History   Social History  . Marital status: Married    Spouse name: N/A  . Number of children: N/A  . Years of education: N/A   Occupational History  . Not on file.   Social History Main Topics  . Smoking status: Former Research scientist (life sciences)  . Smokeless tobacco: Never Used  . Alcohol use No  . Drug use: No  . Sexual activity: Yes    Birth control/ protection: Pill   Other Topics Concern  . Not on file   Social History Narrative  . No narrative on file    No past surgical history on file.  Family History  Problem Relation Age of Onset  . Hypertension Father   . Diabetes Paternal Grandfather   . Hyperlipidemia Father   . Heart disease Father   . Uterine cancer Mother   . Breast cancer Maternal Aunt   . Pancreatic cancer Maternal Grandmother   . Colon cancer Maternal Grandfather      No Known Allergies  No current outpatient prescriptions on file prior to visit.   No current facility-administered medications on file prior to visit.     BP 130/86   Pulse 94   Temp 98.1 F (36.7 C) (Oral)   Ht 5\' 6"  (1.676 m)   Wt 194 lb 12.8 oz (88.4 kg)   LMP 04/27/2017   SpO2 98%   BMI 31.44 kg/m    Objective:   Physical Exam  Constitutional: She appears well-nourished.  Neck: Neck supple.  Cardiovascular: Normal rate and regular rhythm.   Pulmonary/Chest: Effort normal and breath sounds normal.  Skin: Skin is warm and dry.  Psychiatric: She has a normal mood and affect.          Assessment & Plan:

## 2017-05-15 NOTE — Assessment & Plan Note (Signed)
TSH in February normal, but on the higher end, TSH in July at 7. Given symptoms and recent lab level, will increase Armour Thyroid to 90 mg. Will recheck TSH in 6 weeks.

## 2017-05-15 NOTE — Patient Instructions (Signed)
We've increased your Armour Thyroid to 90 mg. Take 1 tablet by mouth every morning on an empty stomach with a full glass of water.  Schedule a lab only appointment in 6 weeks to recheck your thyroid levels.  It was a pleasure to see you today!

## 2017-05-27 ENCOUNTER — Ambulatory Visit: Payer: Self-pay | Admitting: Physician Assistant

## 2017-05-27 ENCOUNTER — Encounter: Payer: Self-pay | Admitting: Physician Assistant

## 2017-05-27 VITALS — BP 110/79 | HR 85 | Temp 98.5°F | Resp 16

## 2017-05-27 DIAGNOSIS — R202 Paresthesia of skin: Secondary | ICD-10-CM

## 2017-05-27 DIAGNOSIS — Z299 Encounter for prophylactic measures, unspecified: Secondary | ICD-10-CM

## 2017-05-27 DIAGNOSIS — M722 Plantar fascial fibromatosis: Secondary | ICD-10-CM

## 2017-05-27 NOTE — Progress Notes (Signed)
S: c/o both feet hurting and having tingling from toes up to calves like socks, no known injury, does know her thyroid is out of "whack", recently had her medication adjusted, feet were hurting then but not tingling, hasn't used any otc meds to help  O: vitals wnl, nad, skin intact, tender at plantar aspect of foot, full rom, n/v intact  A: paresthesias, plantar fasciitis, hypothyroidism  P: labs today, stretches, nsaids, ice, will forward results to [name] at [employer]

## 2017-05-28 LAB — VITAMIN D 25 HYDROXY (VIT D DEFICIENCY, FRACTURES): Vit D, 25-Hydroxy: 36.3 ng/mL (ref 30.0–100.0)

## 2017-05-28 LAB — CMP12+LP+TP+TSH+6AC+CBC/D/PLT
ALT: 14 IU/L (ref 0–32)
AST: 20 IU/L (ref 0–40)
Albumin/Globulin Ratio: 1.8 (ref 1.2–2.2)
Albumin: 4.5 g/dL (ref 3.5–5.5)
Alkaline Phosphatase: 79 IU/L (ref 39–117)
BASOS ABS: 0 10*3/uL (ref 0.0–0.2)
BUN/Creatinine Ratio: 14 (ref 9–23)
BUN: 12 mg/dL (ref 6–24)
Basos: 1 %
Bilirubin Total: <0.2 mg/dL (ref 0.0–1.2)
CHLORIDE: 102 mmol/L (ref 96–106)
Calcium: 9.1 mg/dL (ref 8.7–10.2)
Chol/HDL Ratio: 3.6 ratio (ref 0.0–4.4)
Cholesterol, Total: 164 mg/dL (ref 100–199)
Creatinine, Ser: 0.83 mg/dL (ref 0.57–1.00)
EOS (ABSOLUTE): 0.2 10*3/uL (ref 0.0–0.4)
ESTIMATED CHD RISK: 0.6 times avg. (ref 0.0–1.0)
Eos: 2 %
FREE THYROXINE INDEX: 1.4 (ref 1.2–4.9)
GFR calc Af Amer: 99 mL/min/{1.73_m2} (ref >59)
GFR calc non Af Amer: 86 mL/min/{1.73_m2} (ref >59)
GGT: 13 IU/L (ref 0–60)
GLUCOSE: 74 mg/dL (ref 65–99)
Globulin, Total: 2.5 g/dL (ref 1.5–4.5)
HDL: 46 mg/dL (ref >39)
HEMOGLOBIN: 12.8 g/dL (ref 11.1–15.9)
Hematocrit: 38.5 % (ref 34.0–46.6)
IMMATURE GRANULOCYTES: 0 %
IRON: 51 ug/dL (ref 27–159)
Immature Grans (Abs): 0 10*3/uL (ref 0.0–0.1)
LDH: 217 IU/L (ref 119–226)
LDL Calculated: 74 mg/dL (ref 0–99)
LYMPHS ABS: 2.2 10*3/uL (ref 0.7–3.1)
LYMPHS: 29 %
MCH: 28.3 pg (ref 26.6–33.0)
MCHC: 33.2 g/dL (ref 31.5–35.7)
MCV: 85 fL (ref 79–97)
MONOS ABS: 0.3 10*3/uL (ref 0.1–0.9)
Monocytes: 4 %
NEUTROS PCT: 64 %
Neutrophils Absolute: 4.8 10*3/uL (ref 1.4–7.0)
PHOSPHORUS: 3.4 mg/dL (ref 2.5–4.5)
PLATELETS: 270 10*3/uL (ref 150–379)
Potassium: 4 mmol/L (ref 3.5–5.2)
RBC: 4.53 x10E6/uL (ref 3.77–5.28)
RDW: 14.2 % (ref 12.3–15.4)
Sodium: 139 mmol/L (ref 134–144)
T3 UPTAKE RATIO: 23 % — AB (ref 24–39)
T4 TOTAL: 6.1 ug/dL (ref 4.5–12.0)
TOTAL PROTEIN: 7 g/dL (ref 6.0–8.5)
TSH: 1.71 u[IU]/mL (ref 0.450–4.500)
Triglycerides: 221 mg/dL — ABNORMAL HIGH (ref 0–149)
URIC ACID: 4.2 mg/dL (ref 2.5–7.1)
VLDL Cholesterol Cal: 44 mg/dL — ABNORMAL HIGH (ref 5–40)
WBC: 7.4 10*3/uL (ref 3.4–10.8)

## 2017-05-28 LAB — HGB A1C W/O EAG: Hgb A1c MFr Bld: 4.9 % (ref 4.8–5.6)

## 2017-05-28 LAB — VITAMIN B12: Vitamin B-12: 807 pg/mL (ref 232–1245)

## 2017-05-30 ENCOUNTER — Encounter: Payer: Self-pay | Admitting: Primary Care

## 2017-06-13 ENCOUNTER — Other Ambulatory Visit: Payer: Self-pay

## 2017-06-26 ENCOUNTER — Encounter: Payer: Self-pay | Admitting: Primary Care

## 2017-06-26 ENCOUNTER — Other Ambulatory Visit (INDEPENDENT_AMBULATORY_CARE_PROVIDER_SITE_OTHER): Payer: Managed Care, Other (non HMO)

## 2017-06-26 DIAGNOSIS — E039 Hypothyroidism, unspecified: Secondary | ICD-10-CM | POA: Diagnosis not present

## 2017-06-26 LAB — TSH: TSH: 3.07 u[IU]/mL (ref 0.35–4.50)

## 2017-07-08 ENCOUNTER — Other Ambulatory Visit: Payer: Self-pay | Admitting: Primary Care

## 2017-07-08 DIAGNOSIS — E039 Hypothyroidism, unspecified: Secondary | ICD-10-CM

## 2017-10-17 LAB — HM MAMMOGRAPHY

## 2017-12-16 ENCOUNTER — Ambulatory Visit: Payer: Self-pay | Admitting: Family Medicine

## 2017-12-16 ENCOUNTER — Encounter: Payer: Self-pay | Admitting: Family Medicine

## 2017-12-16 VITALS — BP 132/92 | HR 93 | Temp 97.5°F | Resp 20

## 2017-12-16 DIAGNOSIS — J069 Acute upper respiratory infection, unspecified: Secondary | ICD-10-CM

## 2017-12-16 NOTE — Progress Notes (Signed)
Subjective: cough     Aimee Cole is a 47 y.o. female who presents for evaluation of symptoms of a URI. Symptoms include nasal congestion, productive cough with  green colored sputum, purulent nasal discharge, sinus pressure and chest tightness.  Patient reports bilateral facial pressure over frontal and maxillary sinuses.  Describes her cough as dry at night and productive during the d with ay green sputum.  Reports she initially had a sore throat which resolved Saturday.  Denies history of smoking, asthma, COPD.  Reports a history of pneumonia 7 years ago but that the only similar symptom currently is the tightness in her chest.  Onset of symptoms was 5 days ago, and has been gradually improving since that time.  Treatment to date: Mucinex, tylenol cold and allergy.  Denies rash, nausea, vomiting, diarrhea, SOB, wheezing, chest or back pain, ear pain, sore throat, difficulty swallowing, confusion, headache, body aches, fatigue, fever, chills, ocular pruritis/discharge, initial improvement and then worsening of symptoms. Medical history: Anxiety, hypothyroidism. Denies taking antibiotics in the last 3 months.   Review of Systems Pertinent items noted in HPI and remainder of comprehensive ROS otherwise negative.     Objective:   Physical Exam General: Awake, alert, and oriented. No acute distress. Well developed, hydrated and nourished. Appears stated age.  HEENT: PND noted. No edema or exudates of pharynx or tonsils.  Mild erythema to posterior oropharynx.  No erythema or bulging of TM.  Mild erythema/edema to nasal mucosa.  Mild tenderness to frontal and maxillary sinuses bilaterally, both right and left equally tender.  Supple neck without adenopathy. Cardiac: Heart rate and rhythm are normal. No murmurs, gallops, or rubs are auscultated. S1 and S2 are heard and are of normal intensity.  Respiratory: No signs of respiratory distress. Lungs clear. No tachypnea. Able to speak in full sentences  without dyspnea. Skin: Skin is warm, dry and intact. Appropriate color for ethnicity. No cyanosis noted.    Assessment:    viral upper respiratory illness   Plan:    Discussed diagnosis and treatment of URI. Discussed the importance of avoiding unnecessary antibiotic therapy. Suggested symptomatic OTC remedies. Nasal saline spray for congestion. Follow up as needed. Patient's systolic blood pressure today is 92, discussed monitoring her blood pressure at home and following up with a primary care provider continues to remain elevated.  Informed patient to seek care if she develops shortness of breath, fever, or worsening of illness.  New Prescriptions   No medications on file

## 2018-01-08 ENCOUNTER — Ambulatory Visit: Payer: Self-pay

## 2018-01-08 ENCOUNTER — Ambulatory Visit: Payer: Managed Care, Other (non HMO) | Admitting: Family Medicine

## 2018-01-08 ENCOUNTER — Encounter: Payer: Self-pay | Admitting: Family Medicine

## 2018-01-08 VITALS — BP 124/82 | HR 111 | Temp 98.7°F | Wt 199.0 lb

## 2018-01-08 DIAGNOSIS — R51 Headache: Secondary | ICD-10-CM | POA: Diagnosis not present

## 2018-01-08 DIAGNOSIS — E039 Hypothyroidism, unspecified: Secondary | ICD-10-CM

## 2018-01-08 DIAGNOSIS — R110 Nausea: Secondary | ICD-10-CM

## 2018-01-08 DIAGNOSIS — R0789 Other chest pain: Secondary | ICD-10-CM | POA: Diagnosis not present

## 2018-01-08 DIAGNOSIS — R519 Headache, unspecified: Secondary | ICD-10-CM

## 2018-01-08 MED ORDER — CYCLOBENZAPRINE HCL 10 MG PO TABS: 5.0000 mg | ORAL_TABLET | Freq: Two times a day (BID) | ORAL | 0 refills | Status: DC | PRN

## 2018-01-08 MED ORDER — ONDANSETRON 4 MG PO TBDP
4.0000 mg | ORAL_TABLET | Freq: Once | ORAL | Status: AC
  Administered 2018-01-08: 4 mg via ORAL

## 2018-01-08 MED ORDER — ONDANSETRON HCL 4 MG PO TABS: 4.0000 mg | ORAL_TABLET | Freq: Three times a day (TID) | ORAL | 0 refills | Status: DC | PRN

## 2018-01-08 NOTE — Telephone Encounter (Signed)
Pt. States woke up at 3 this morning with nausea and back pain. States she "does have a lot going on with sick family so it could be some anxiety." Request to be seen today. Instructed to go to ED if symptoms worsen. Verbalizes understanding.  Reason for Disposition . Nausea persists > 1 week  Answer Assessment - Initial Assessment Questions 1. NAUSEA SEVERITY: "How bad is the nausea?" (e.g., mild, moderate, severe; dehydration, weight loss)   - MILD: loss of appetite without change in eating habits   - MODERATE: decreased oral intake without significant weight loss, dehydration, or malnutrition   - SEVERE: inadequate caloric or fluid intake, significant weight loss, symptoms of dehydration     Mild 2. ONSET: "When did the nausea begin?"     Started this morning 3. VOMITING: "Any vomiting?" If so, ask: "How many times today?"     No 4. RECURRENT SYMPTOM: "Have you had nausea before?" If so, ask: "When was the last time?" "What happened that time?"     No 5. CAUSE: "What do you think is causing the nausea?"     Anxiety 6. PREGNANCY: "Is there any chance you are pregnant?" (e.g., unprotected intercourse, missed birth control pill, broken condom)     No  Protocols used: NAUSEA-A-AH

## 2018-01-08 NOTE — Telephone Encounter (Signed)
Pt has appt with Dr Darnell Level 01/08/18 at 5 PM.

## 2018-01-08 NOTE — Progress Notes (Signed)
BP 124/82 (BP Location: Left Arm, Patient Position: Sitting, Cuff Size: Normal)   Pulse (!) 111   Temp 98.7 F (37.1 C) (Oral)   Wt 199 lb (90.3 kg)   LMP 01/02/2018   SpO2 96%   BMI 32.12 kg/m    CC: back pain, headache, chest pain, nausea Subjective:    Patient ID: Rockwell Alexandria, female    DOB: Jan 28, 1971, 47 y.o.   MRN: [id number]  HPI: GAVIN FAIVRE is a 47 y.o. female presenting on 01/08/2018 for Back Pain (Pain in upper mid back between shoulder blades.) and Nausea (Started about 3:00 AM this morning. Also, had vomiting. Thinks it may be vertigo. Took some of old rx for vertigo, vomited afterwards.)   Nausea woke her up this morning around 3:30am. Chest tightness/discomfort started at 9-10am. This radiates to her mid thoracic back and up into neck, causing pressure headache. Vomited this afternoon x3 - NBNB (watery). No appetite. Just drinking gatorade. Left work early.  Laying on her side felt better. Laying supine was worse, worse with sitting/standing up.  No vertigo, presyncope. Meclizine didn't help this time.   No fevers/chills, ear or tooth pain, dyspnea, palpitations, coughing, ST, head congestion. No abd pain or cramping, diarrhea, constipation. No confusion, unilateral weakness, slurred speech, or paresthesias. No vision changes. No tinnitus or diplopia.  No sick contacts at home.  No recent travel or new restaurants.  No mosquito or tick bites.  No new rashes.  No new medicines. No new supplements. No alcohol, rec drugs. Non smoker.  URI a few weeks ago.  No h/o migraines or headaches.  H/o vertigo remotely treated with meclizine   LMP - 01/02/2018 - normal, regular. Father with 62v CABG age late 47s, paternal grandmother with CAD as well.   H/o GAD. High stress over last 2 weeks. Husband's family in hospital with illnesses, stressors at work. Wonders if this contributes.   Relevant past medical, surgical, family and social history reviewed and updated as  indicated. Interim medical history since our last visit reviewed. Allergies and medications reviewed and updated. Outpatient Medications Prior to Visit  Medication Sig Dispense Refill  . ARMOUR THYROID 90 MG tablet TAKE 1 TABLET BY MOUTH EVERY MORNING ON AN EMPTY STOMACH WITH A FULL GLASS OF WATER. 30 tablet 5   No facility-administered medications prior to visit.      Per HPI unless specifically indicated in ROS section below Review of Systems     Objective:    BP 124/82 (BP Location: Left Arm, Patient Position: Sitting, Cuff Size: Normal)   Pulse (!) 111   Temp 98.7 F (37.1 C) (Oral)   Wt 199 lb (90.3 kg)   LMP 01/02/2018   SpO2 96%   BMI 32.12 kg/m   Wt Readings from Last 3 Encounters:  01/08/18 199 lb (90.3 kg)  05/15/17 194 lb 12.8 oz (88.4 kg)  02/27/17 193 lb 8 oz (87.8 kg)    Physical Exam  Constitutional: She is oriented to person, place, and time. She appears well-developed and well-nourished. No distress.  HENT:  Head: Normocephalic and atraumatic.  Right Ear: Hearing, tympanic membrane, external ear and ear canal normal.  Left Ear: Hearing, tympanic membrane, external ear and ear canal normal.  Nose: Nose normal. No mucosal edema or rhinorrhea. Right sinus exhibits no maxillary sinus tenderness and no frontal sinus tenderness. Left sinus exhibits no maxillary sinus tenderness and no frontal sinus tenderness.  Mouth/Throat: Uvula is midline, oropharynx is clear and moist  and mucous membranes are normal. No oropharyngeal exudate, posterior oropharyngeal edema, posterior oropharyngeal erythema or tonsillar abscesses.  Eyes: Pupils are equal, round, and reactive to light. Conjunctivae and EOM are normal. No scleral icterus.  No obvious papilledema on very limited fundoscopic exam  Neck: Normal range of motion. Neck supple. No thyromegaly present.  Cardiovascular: Normal rate, regular rhythm, normal heart sounds and intact distal pulses.  No murmur heard. Pulses:       Radial pulses are 2+ on the right side, and 2+ on the left side.  Pulmonary/Chest: Effort normal and breath sounds normal. No respiratory distress. She has no wheezes. She has no rales. She exhibits tenderness (some reproducible chest discomfort mid upper sternum).  Abdominal: Soft. Normal appearance and bowel sounds are normal. She exhibits no distension and no mass. There is no hepatosplenomegaly. There is tenderness (moderate) in the epigastric area. There is no rigidity, no rebound, no guarding, no CVA tenderness and negative Murphy's sign. No hernia.  Musculoskeletal: Normal range of motion. She exhibits no edema.  FROM at cervical neck with discomfort to palpation bilateral paracervical mm and into trapezius muscles  Lymphadenopathy:    She has no cervical adenopathy.  Neurological: She is alert and oriented to person, place, and time. She has normal strength. No cranial nerve deficit or sensory deficit. She displays a negative Romberg sign. Coordination and gait normal.  CN 2-12 intact FTN intact EOMI  Station and gait intact Neg kernig, bruzynski signs  Skin: Skin is warm and dry. No rash noted.  Psychiatric: She has a normal mood and affect. Her behavior is normal. Judgment and thought content normal.  Nursing note and vitals reviewed.   Results for orders placed or performed in visit on 06/26/17  TSH  Result Value Ref Range   TSH 3.07 0.35 - 4.50 uIU/mL   EKG - NSR rate 80s, normal axis, intervals, no acute ST/T changes.     Assessment & Plan:   Problem List Items Addressed This Visit    Acute nonintractable headache - Primary    1d h/o marked headache/pressure sensation associated with nausea, vomiting, chest and back pain. She also has significant cervical neck discomfort but no meningeal signs. Unclear cause. ?cervicogenic headache, ?intracranial hypertension (pseudotumor) however pt denies any vision changes. Given this is a new significant headache (no prior h/o  migraines), I do think it prudent to check head CT for further evaluation. In interim will treat headache with muscle relaxant course with sedation precautions (flexeril) Pt agrees with plan.       Relevant Medications   cyclobenzaprine (FLEXERIL) 10 MG tablet   Other Relevant Orders   CT Head Wo Contrast   Chest pressure    Nonspecific chest pressure sensation that is not exertional or relieved by rest. She does have fmhx premature CAD (father age 13) however no other significant risk factors. Check EKG today.  She does endorse worsening heartburn, burning epigastric pain - I recommended she start zantac OTC daily.  zofran PRN nausea.  Given zofran 4mg  in office for nausea - with improvement.       Relevant Orders   EKG 12-Lead (Completed)   Hypothyroidism    Compliant with armour thyroid. Denies missed dose recently.        Other Visit Diagnoses    Nausea       Relevant Medications   ondansetron (ZOFRAN-ODT) disintegrating tablet 4 mg (Completed)       Meds ordered this encounter  Medications  . ondansetron (  ZOFRAN-ODT) disintegrating tablet 4 mg  . cyclobenzaprine (FLEXERIL) 10 MG tablet    Sig: Take 0.5-1 tablets (5-10 mg total) by mouth 2 (two) times daily as needed for muscle spasms (sedation precautions).    Dispense:  30 tablet    Refill:  0  . ondansetron (ZOFRAN) 4 MG tablet    Sig: Take 1 tablet (4 mg total) by mouth every 8 (eight) hours as needed for nausea or vomiting.    Dispense:  20 tablet    Refill:  0   Orders Placed This Encounter  Procedures  . CT Head Wo Contrast    Standing Status:   Future    Standing Expiration Date:   04/11/2019    Order Specific Question:   Is patient pregnant?    Answer:   No    Order Specific Question:   Preferred imaging location?    Answer:   [facility]    Order Specific Question:   Radiology Contrast Protocol - do NOT remove file path    Answer:   [email or web address]    Order Specific  Question:   Reason for Exam additional comments    Answer:   new onset headache, vomiting, head pressure  . EKG 12-Lead    Follow up plan: Return if symptoms worsen or fail to improve.  Ria Bush, MD

## 2018-01-08 NOTE — Patient Instructions (Addendum)
EKG looking ok today We will check head CT for new onset headache with vomiting.  Zofran for nausea. Start taking zantac daily for heartburn.  Try flexeril muscle relaxant for neck pain If worsening overnight - go to ER.

## 2018-01-09 DIAGNOSIS — R51 Headache: Secondary | ICD-10-CM

## 2018-01-09 DIAGNOSIS — R0789 Other chest pain: Secondary | ICD-10-CM | POA: Insufficient documentation

## 2018-01-09 DIAGNOSIS — R519 Headache, unspecified: Secondary | ICD-10-CM | POA: Insufficient documentation

## 2018-01-09 NOTE — Assessment & Plan Note (Addendum)
Nonspecific chest pressure sensation that is not exertional or relieved by rest. She does have fmhx premature CAD (father age 46) however no other significant risk factors. Check EKG today.  She does endorse worsening heartburn, burning epigastric pain - I recommended she start zantac OTC daily.  zofran PRN nausea.  Given zofran 4mg  in office for nausea - with improvement.

## 2018-01-09 NOTE — Assessment & Plan Note (Addendum)
1d h/o marked headache/pressure sensation associated with nausea, vomiting, chest and back pain. She also has significant cervical neck discomfort but no meningeal signs. Unclear cause. ?cervicogenic headache, ?intracranial hypertension (pseudotumor) however pt denies any vision changes. Given this is a new significant headache (no prior h/o migraines), I do think it prudent to check head CT for further evaluation. In interim will treat headache with muscle relaxant course with sedation precautions (flexeril) Pt agrees with plan.

## 2018-01-09 NOTE — Assessment & Plan Note (Signed)
Compliant with armour thyroid. Denies missed dose recently.

## 2018-01-10 ENCOUNTER — Inpatient Hospital Stay: Admission: RE | Admit: 2018-01-10 | Payer: Self-pay | Source: Ambulatory Visit

## 2018-01-10 ENCOUNTER — Ambulatory Visit (INDEPENDENT_AMBULATORY_CARE_PROVIDER_SITE_OTHER)
Admission: RE | Admit: 2018-01-10 | Discharge: 2018-01-10 | Disposition: A | Discharge status: Home / Self Care | Payer: Managed Care, Other (non HMO) | Source: Ambulatory Visit | Attending: Family Medicine | Admitting: Family Medicine

## 2018-01-10 DIAGNOSIS — R519 Headache, unspecified: Secondary | ICD-10-CM

## 2018-01-10 DIAGNOSIS — R51 Headache: Secondary | ICD-10-CM

## 2018-01-11 ENCOUNTER — Other Ambulatory Visit: Payer: Self-pay | Admitting: Primary Care

## 2018-01-11 DIAGNOSIS — E039 Hypothyroidism, unspecified: Secondary | ICD-10-CM

## 2018-01-12 ENCOUNTER — Encounter: Payer: Self-pay | Admitting: Family Medicine

## 2018-01-14 MED ORDER — BUTALBITAL-APAP-CAFFEINE 50-325-40 MG PO TABS: 1.0000 | ORAL_TABLET | Freq: Two times a day (BID) | ORAL | 0 refills | Status: DC | PRN

## 2018-02-26 ENCOUNTER — Encounter: Payer: Self-pay | Admitting: Primary Care

## 2018-05-27 ENCOUNTER — Ambulatory Visit: Payer: Managed Care, Other (non HMO) | Admitting: Internal Medicine

## 2018-05-27 ENCOUNTER — Encounter: Payer: Self-pay | Admitting: Internal Medicine

## 2018-05-27 VITALS — BP 120/80 | HR 96 | Temp 98.2°F | Wt 194.0 lb

## 2018-05-27 DIAGNOSIS — K921 Melena: Secondary | ICD-10-CM | POA: Diagnosis not present

## 2018-05-27 DIAGNOSIS — R110 Nausea: Secondary | ICD-10-CM | POA: Diagnosis not present

## 2018-05-27 DIAGNOSIS — R197 Diarrhea, unspecified: Secondary | ICD-10-CM

## 2018-05-27 DIAGNOSIS — R1084 Generalized abdominal pain: Secondary | ICD-10-CM | POA: Diagnosis not present

## 2018-05-27 LAB — COMPREHENSIVE METABOLIC PANEL
ALBUMIN: 4 g/dL (ref 3.5–5.2)
ALT: 19 U/L (ref 0–35)
AST: 24 U/L (ref 0–37)
Alkaline Phosphatase: 63 U/L (ref 39–117)
BUN: 7 mg/dL (ref 6–23)
CALCIUM: 9.3 mg/dL (ref 8.4–10.5)
CHLORIDE: 101 meq/L (ref 96–112)
CO2: 28 mEq/L (ref 19–32)
CREATININE: 0.99 mg/dL (ref 0.40–1.20)
GFR: 64.25 mL/min (ref >60.00)
Glucose, Bld: 124 mg/dL — ABNORMAL HIGH (ref 70–99)
POTASSIUM: 4 meq/L (ref 3.5–5.1)
Sodium: 136 mEq/L (ref 135–145)
Total Bilirubin: 0.5 mg/dL (ref 0.2–1.2)
Total Protein: 6.9 g/dL (ref 6.0–8.3)

## 2018-05-27 LAB — CBC
HEMATOCRIT: 39.4 % (ref 36.0–46.0)
HEMOGLOBIN: 13.4 g/dL (ref 12.0–15.0)
MCHC: 34 g/dL (ref 30.0–36.0)
MCV: 83.4 fl (ref 78.0–100.0)
Platelets: 213 10*3/uL (ref 150.0–400.0)
RBC: 4.73 Mil/uL (ref 3.87–5.11)
RDW: 14.1 % (ref 11.5–15.5)
WBC: 5.7 10*3/uL (ref 4.0–10.5)

## 2018-05-27 LAB — AMYLASE: Amylase: 40 U/L (ref 27–131)

## 2018-05-27 LAB — LIPASE: LIPASE: 28 U/L (ref 11.0–59.0)

## 2018-05-27 LAB — H. PYLORI ANTIBODY, IGG: H Pylori IgG: NEGATIVE

## 2018-05-27 MED ORDER — OMEPRAZOLE 20 MG PO CPDR: 20.0000 mg | DELAYED_RELEASE_CAPSULE | Freq: Every day | ORAL | 3 refills | Status: DC

## 2018-05-27 MED ORDER — PROMETHAZINE HCL 25 MG PO TABS: 25.0000 mg | ORAL_TABLET | Freq: Three times a day (TID) | ORAL | 0 refills | Status: DC | PRN

## 2018-05-27 NOTE — Progress Notes (Signed)
Subjective:    Patient ID: Aimee Cole, female    DOB: 1971-05-28, 47 y.o.   MRN: [id number]  HPI  Pt presents to the clinic today with c/o nausea, abdominal pain, and diarrhea. She reports this started 4 days ago. She denies vomiting. She reports the abdominal pain is located in her lower abdomen. She has multiple loose stools per day. She has noticed some blood in her stool. She has felt feverish but has not taken her temperature. She denies chills or body aches. She denies recent travel outside the country. She denies recent antibiotic use. She denies recent changes in diet or medications. No one around her has similar symptoms.  Review of Systems      No past medical history on file.  Current Outpatient Medications  Medication Sig Dispense Refill  . ARMOUR THYROID 90 MG tablet TAKE 1 TABLET BY MOUTH EVERY MORNING ON AN EMPTY STOMACH WITH A FULL GLASS OF WATER. 30 tablet 5  . ondansetron (ZOFRAN) 4 MG tablet Take 1 tablet (4 mg total) by mouth every 8 (eight) hours as needed for nausea or vomiting. 20 tablet 0   No current facility-administered medications for this visit.     No Known Allergies  Family History  Problem Relation Age of Onset  . Hypertension Father   . Diabetes Paternal Grandfather   . Hyperlipidemia Father   . Heart disease Father   . Uterine cancer Mother   . Breast cancer Maternal Aunt   . Pancreatic cancer Maternal Grandmother   . Colon cancer Maternal Grandfather     Social History   Socioeconomic History  . Marital status: Married    Spouse name: Not on file  . Number of children: Not on file  . Years of education: Not on file  . Highest education level: Not on file  Occupational History  . Not on file  Social Needs  . Financial resource strain: Not on file  . Food insecurity:    Worry: Not on file    Inability: Not on file  . Transportation needs:    Medical: Not on file    Non-medical: Not on file  Tobacco Use  . Smoking status:  Former Research scientist (life sciences)  . Smokeless tobacco: Never Used  Substance and Sexual Activity  . Alcohol use: No    Alcohol/week: 0.0 oz  . Drug use: No  . Sexual activity: Yes    Birth control/protection: Pill  Lifestyle  . Physical activity:    Days per week: Not on file    Minutes per session: Not on file  . Stress: Not on file  Relationships  . Social connections:    Talks on phone: Not on file    Gets together: Not on file    Attends religious service: Not on file    Active member of club or organization: Not on file    Attends meetings of clubs or organizations: Not on file    Relationship status: Not on file  . Intimate partner violence:    Fear of current or ex partner: Not on file    Emotionally abused: Not on file    Physically abused: Not on file    Forced sexual activity: Not on file  Other Topics Concern  . Not on file  Social History Narrative  . Not on file     Constitutional: Denies fever, malaise, fatigue, headache or abrupt weight changes.  Respiratory: Denies difficulty breathing, shortness of breath, cough or sputum production.  Cardiovascular: Denies chest pain, chest tightness, palpitations or swelling in the hands or feet.  Gastrointestinal: Pt reports abdominal pain, bloating, diarrhea and blood in stool. Denies constipation.  GU: Denies urgency, frequency, pain with urination, burning sensation, blood in urine, odor or discharge.  No other specific complaints in a complete review of systems (except as listed in HPI above).  Objective:   Physical Exam  BP 120/80   Pulse 96   Temp 98.2 F (36.8 C) (Oral)   Wt 194 lb (88 kg)   LMP 05/17/2018   SpO2 98%   BMI 31.31 kg/m  Wt Readings from Last 3 Encounters:  05/27/18 194 lb (88 kg)  01/08/18 199 lb (90.3 kg)  05/15/17 194 lb 12.8 oz (88.4 kg)    General: Appears her stated age, ill appearing, in NAD. Skin: Warm, dry and intact. No rashes noted. Cardiovascular: Normal rate and rhythm. S1,S2 noted.  No  murmur, rubs or gallops noted.  Pulmonary/Chest: Normal effort and positive vesicular breath sounds. No respiratory distress. No wheezes, rales or ronchi noted.  Abdomen: Soft and tender in the epigastric region and bilateral lower quadrants. Normal bowel sounds. No distention or masses noted.  BMET    Component Value Date/Time   NA 139 05/27/2017 1136   K 4.0 05/27/2017 1136   CL 102 05/27/2017 1136   CO2 30 05/08/2016 1259   GLUCOSE 74 05/27/2017 1136   GLUCOSE 104 (H) 05/08/2016 1259   BUN 12 05/27/2017 1136   CREATININE 0.83 05/27/2017 1136   CALCIUM 9.1 05/27/2017 1136   GFRNONAA 86 05/27/2017 1136   GFRAA 99 05/27/2017 1136    Lipid Panel     Component Value Date/Time   CHOL 164 05/27/2017 1136   TRIG 221 (H) 05/27/2017 1136   HDL 46 05/27/2017 1136   CHOLHDL 3.6 05/27/2017 1136   LDLCALC 74 05/27/2017 1136    CBC    Component Value Date/Time   WBC 7.4 05/27/2017 1136   WBC 8.5 05/08/2016 1259   RBC 4.53 05/27/2017 1136   RBC 4.59 05/08/2016 1259   HGB 12.8 05/27/2017 1136   HCT 38.5 05/27/2017 1136   PLT 270 05/27/2017 1136   MCV 85 05/27/2017 1136   MCH 28.3 05/27/2017 1136   MCH 28.7 10/20/2010 0535   MCHC 33.2 05/27/2017 1136   MCHC 33.6 05/08/2016 1259   RDW 14.2 05/27/2017 1136   LYMPHSABS 2.2 05/27/2017 1136   EOSABS 0.2 05/27/2017 1136   BASOSABS 0.0 05/27/2017 1136    Hgb A1C Lab Results  Component Value Date   HGBA1C 4.9 05/27/2017            Assessment & Plan:   Generalized Abdominal Pain, Nausea, Blood Diarrhea:  She has never had a colonoscopy or family history of diverticulosis Hemoccult positive ? Salmonella- discussed viral etiology, duration of symptoms, treatment Will check CBC, CMET, Amylase, Lipase, GI Pathogen Panel and H Pylori eRx for Promethazine 12.5 mg every 8 hours prn eRx for Prilosec 20 mg daily x 1 week Discussed clear liquid diet for now If worsens, consider CT scan abdomen  Return/ER precautions  discussed Webb Silversmith, NP

## 2018-05-27 NOTE — Patient Instructions (Signed)
Diarrhea, Adult °Diarrhea is when you have loose and water poop (stool) often. Diarrhea can make you feel weak and cause you to get dehydrated. Dehydration can make you tired and thirsty, make you have a dry mouth, and make it so you pee (urinate) less often. Diarrhea often lasts 2-3 days. However, it can last longer if it is a sign of something more serious. It is important to treat your diarrhea as told by your doctor. °Follow these instructions at home: °Eating and drinking ° °Follow these recommendations as told by your doctor: °· Take an oral rehydration solution (ORS). This is a drink that is sold at pharmacies and stores. °· Drink clear fluids, such as: °? Water. °? Ice chips. °? Diluted fruit juice. °? Low-calorie sports drinks. °· Eat bland, easy-to-digest foods in small amounts as you are able. These foods include: °? Bananas. °? Applesauce. °? Rice. °? Low-fat (lean) meats. °? Toast. °? Crackers. °· Avoid drinking fluids that have a lot of sugar or caffeine in them. °· Avoid alcohol. °· Avoid spicy or fatty foods. ° °General instructions ° °· Drink enough fluid to keep your pee (urine) clear or pale yellow. °· Wash your hands often. If you cannot use soap and water, use hand sanitizer. °· Make sure that all people in your home wash their hands well and often. °· Take over-the-counter and prescription medicines only as told by your doctor. °· Rest at home while you get better. °· Watch your condition for any changes. °· Take a warm bath to help with any burning or pain from having diarrhea. °· Keep all follow-up visits as told by your doctor. This is important. °Contact a doctor if: °· You have a fever. °· Your diarrhea gets worse. °· You have new symptoms. °· You cannot keep fluids down. °· You feel light-headed or dizzy. °· You have a headache. °· You have muscle cramps. °Get help right away if: °· You have chest pain. °· You feel very weak or you pass out (faint). °· You have bloody or black poop or  poop that look like tar. °· You have very bad pain, cramping, or bloating in your belly (abdomen). °· You have trouble breathing or you are breathing very quickly. °· Your heart is beating very quickly. °· Your skin feels cold and clammy. °· You feel confused. °· You have signs of dehydration, such as: °? Dark pee, hardly any pee, or no pee. °? Cracked lips. °? Dry mouth. °? Sunken eyes. °? Sleepiness. °? Weakness. °This information is not intended to replace advice given to you by your health care provider. Make sure you discuss any questions you have with your health care provider. °Document Released: 03/26/2008 Document Revised: 04/27/2016 Document Reviewed: 06/14/2015 °Elsevier Interactive Patient Education © 2018 Elsevier Inc[employer] ° °

## 2018-05-28 ENCOUNTER — Encounter: Payer: Self-pay | Admitting: Internal Medicine

## 2018-05-29 LAB — GASTROINTESTINAL PATHOGEN PANEL PCR
C. difficile Tox A/B, PCR: NOT DETECTED
CAMPYLOBACTER, PCR: NOT DETECTED
Cryptosporidium, PCR: NOT DETECTED
E coli (ETEC) LT/ST PCR: NOT DETECTED
E coli (STEC) stx1/stx2, PCR: NOT DETECTED
E coli 0157, PCR: NOT DETECTED
GIARDIA LAMBLIA, PCR: NOT DETECTED
NOROVIRUS, PCR: NOT DETECTED
Rotavirus A, PCR: NOT DETECTED
SHIGELLA, PCR: NOT DETECTED
Salmonella, PCR: NOT DETECTED

## 2018-06-17 ENCOUNTER — Ambulatory Visit: Payer: Self-pay | Admitting: Family Medicine

## 2018-06-17 VITALS — BP 134/95 | HR 81 | Temp 98.1°F | Resp 18

## 2018-06-17 DIAGNOSIS — J029 Acute pharyngitis, unspecified: Secondary | ICD-10-CM

## 2018-06-17 DIAGNOSIS — H6983 Other specified disorders of Eustachian tube, bilateral: Secondary | ICD-10-CM

## 2018-06-17 DIAGNOSIS — H6993 Unspecified Eustachian tube disorder, bilateral: Secondary | ICD-10-CM

## 2018-06-17 DIAGNOSIS — R059 Cough, unspecified: Secondary | ICD-10-CM

## 2018-06-17 DIAGNOSIS — R509 Fever, unspecified: Secondary | ICD-10-CM

## 2018-06-17 DIAGNOSIS — R05 Cough: Secondary | ICD-10-CM

## 2018-06-17 LAB — POCT RAPID STREP A (OFFICE): RAPID STREP A SCREEN: NEGATIVE

## 2018-06-17 LAB — POCT INFLUENZA A/B
INFLUENZA A, POC: NEGATIVE
Influenza B, POC: NEGATIVE

## 2018-06-17 MED ORDER — BENZONATATE 200 MG PO CAPS
200.0000 mg | ORAL_CAPSULE | Freq: Every evening | ORAL | 0 refills | Status: DC | PRN
Start: 2018-06-17 — End: 2018-10-06

## 2018-06-17 MED ORDER — FLUTICASONE PROPIONATE 50 MCG/ACT NA SUSP: 2.0000 | Freq: Every day | NASAL | 0 refills | Status: DC

## 2018-06-17 NOTE — Progress Notes (Signed)
Subjective: sore throat     Aimee Cole is a 47 y.o. female who presents for evaluation of sore throat, mild nonproductive cough, chills, fatigue, low-grade fever (T-max 101 last night), and headache since Sunday.  Denies fever today.  Denies contact with anyone diagnosed with strep pharyngitis.  Reports contact with a friend last week who was diagnosed with the flu.  Denies any other sick contacts or relevant exposures.  Reports mild facial pressure but denies any nasal congestion or drainage.  Reports bilateral ear pressure. Treatment to date: Mucinex and ibuprofen. Denies rash, nausea, vomiting, diarrhea, SOB, wheezing, chest or back pain, ear pain,  difficulty swallowing, confusion, nasal congestion, purulent nasal discharge, anosmia/hyposmia, dental pain, body aches, severe symptoms, ocular pruritis/discharge, or initial improvement and then worsening of symptoms. History of smoking, asthma, COPD: Negative.  Review of Systems Pertinent items noted in HPI and remainder of comprehensive ROS otherwise negative.     Objective:   Physical Exam General: Awake, alert, and oriented. No acute distress. Well developed, hydrated and nourished. Appears stated age. Nontoxic appearance.  HEENT: No PND noted.  Mild erythema to posterior oropharynx.  No edema or exudates of pharynx or tonsils. No erythema, opaque fluid, or bulging of TM.  Mild left tympanic membrane retraction.  Mild erythema/edema to nasal mucosa. Sinuses nontender. Supple neck without adenopathy. Cardiac: Heart rate and rhythm are normal. No murmurs, gallops, or rubs are auscultated. S1 and S2 are heard and are of normal intensity.  Respiratory: No signs of respiratory distress. Lungs clear. No tachypnea. Able to speak in full sentences without dyspnea. Nonlabored respirations.  Skin: Skin is warm, dry and intact. Appropriate color for ethnicity. No cyanosis noted.   Diagnostic Results:  Rapid strep negative Rapid flu  negative   Assessment:   Acute viral upper respiratory infection Eustachian tube dysfunction  Plan:    Discussed diagnosis and treatment of URI and eustachian tube dysfunction. Discussed the importance of avoiding unnecessary antibiotic therapy. Suggested symptomatic OTC remedies and prescribed Flonase. Nasal saline spray for congestion.   Prescribed Tessalon Perles to use as needed at night for cough. Discussed side/adverse effects and directions of medications prescribed. Advised patient to follow-up with her primary care provider if no improvement in the next 3 to 5 days, she develops new symptoms,has any concerns, or develops worsening of symptoms.  Discussed red flag symptoms and circumstances with which to seek medical care.   New Prescriptions   BENZONATATE (TESSALON) 200 MG CAPSULE    Take 1 capsule (200 mg total) by mouth at bedtime as needed for cough.   FLUTICASONE (FLONASE) 50 MCG/ACT NASAL SPRAY    Place 2 sprays into both nostrils daily.

## 2018-07-14 ENCOUNTER — Other Ambulatory Visit: Payer: Self-pay | Admitting: Primary Care

## 2018-07-14 DIAGNOSIS — E039 Hypothyroidism, unspecified: Secondary | ICD-10-CM

## 2018-10-06 ENCOUNTER — Encounter: Payer: Self-pay | Admitting: Primary Care

## 2018-10-06 ENCOUNTER — Ambulatory Visit (INDEPENDENT_AMBULATORY_CARE_PROVIDER_SITE_OTHER): Payer: BLUE CROSS/BLUE SHIELD | Admitting: Primary Care

## 2018-10-06 VITALS — BP 120/84 | HR 83 | Temp 98.0°F | Ht 66.0 in | Wt 198.5 lb

## 2018-10-06 DIAGNOSIS — R030 Elevated blood-pressure reading, without diagnosis of hypertension: Secondary | ICD-10-CM | POA: Diagnosis not present

## 2018-10-06 DIAGNOSIS — F411 Generalized anxiety disorder: Secondary | ICD-10-CM | POA: Diagnosis not present

## 2018-10-06 DIAGNOSIS — Z0000 Encounter for general adult medical examination without abnormal findings: Secondary | ICD-10-CM | POA: Diagnosis not present

## 2018-10-06 DIAGNOSIS — E039 Hypothyroidism, unspecified: Secondary | ICD-10-CM

## 2018-10-06 LAB — COMPREHENSIVE METABOLIC PANEL
ALBUMIN: 4.3 g/dL (ref 3.5–5.2)
ALT: 15 U/L (ref 0–35)
AST: 18 U/L (ref 0–37)
Alkaline Phosphatase: 66 U/L (ref 39–117)
BUN: 9 mg/dL (ref 6–23)
CO2: 29 meq/L (ref 19–32)
CREATININE: 0.99 mg/dL (ref 0.40–1.20)
Calcium: 9.3 mg/dL (ref 8.4–10.5)
Chloride: 102 mEq/L (ref 96–112)
GFR: 64.15 mL/min (ref >60.00)
GLUCOSE: 97 mg/dL (ref 70–99)
Potassium: 4 mEq/L (ref 3.5–5.1)
SODIUM: 136 meq/L (ref 135–145)
Total Bilirubin: 0.4 mg/dL (ref 0.2–1.2)
Total Protein: 7.1 g/dL (ref 6.0–8.3)

## 2018-10-06 LAB — TSH: TSH: 11.15 u[IU]/mL — ABNORMAL HIGH (ref 0.35–4.50)

## 2018-10-06 LAB — LIPID PANEL
CHOL/HDL RATIO: 3
CHOLESTEROL: 165 mg/dL (ref 0–200)
HDL: 58.3 mg/dL (ref >39.00)
LDL CALC: 72 mg/dL (ref 0–99)
NONHDL: 106.84
Triglycerides: 172 mg/dL — ABNORMAL HIGH (ref 0.0–149.0)
VLDL: 34.4 mg/dL (ref 0.0–40.0)

## 2018-10-06 LAB — T4, FREE: Free T4: 0.5 ng/dL — ABNORMAL LOW (ref 0.60–1.60)

## 2018-10-06 NOTE — Assessment & Plan Note (Signed)
Immunizations UTD. Pap smear UTD per patient, follows with GYN. Mammogram due, follows with GYN and will have done. Recommended to continue with regular exercise, continue to work on diet. Exam unremarkable. Labs pending. Follow up in 1 year for CPE.

## 2018-10-06 NOTE — Assessment & Plan Note (Signed)
Overall much better since changing occupations. Continue to monitor.

## 2018-10-06 NOTE — Assessment & Plan Note (Signed)
Stopped Armour Thyroid medication in July/August 2019 after acute illness, never resumed.  Repeat thyroid function today.

## 2018-10-06 NOTE — Assessment & Plan Note (Signed)
Normotensive today. Continue to monitor.

## 2018-10-06 NOTE — Progress Notes (Signed)
Subjective:    Patient ID: Aimee Cole, female    DOB: 12-03-1970, 47 y.o.   MRN: [id number]  HPI  Aimee Cole is a 47 year old female who who presents today for complete physical.  Immunizations: -Tetanus: Completed  -Influenza: Completed this season   Diet: She endorses a healthy diet. Breakfast: Oatmeal Lunch: Salad Dinner: Protein, vegetables, starch  Snacks: Nuts, pretzels, trail mix, granola bars Desserts: Once weekly  Beverages: Coffee, water, milk  Exercise: She is walking on her treadmill twice weekly for 1-1.5 hours. Eye exam: Completed in 2019 Dental exam: Completes semi-annually  Pap Smear: UTD. Normal.  Mammogram: Completed in 2018  BP Readings from Last 3 Encounters:  10/06/18 120/84  06/17/18 (!) 134/95  05/27/18 120/80      Review of Systems  Constitutional: Negative for unexpected weight change.  HENT: Negative for rhinorrhea.   Respiratory: Negative for cough and shortness of breath.   Cardiovascular: Negative for chest pain.  Gastrointestinal: Negative for constipation and diarrhea.  Genitourinary: Negative for difficulty urinating and menstrual problem.  Musculoskeletal: Negative for arthralgias and myalgias.  Skin: Negative for rash.  Allergic/Immunologic: Negative for environmental allergies.  Neurological: Negative for dizziness, numbness and headaches.  Psychiatric/Behavioral: The patient is not nervous/anxious.        No past medical history on file.   Social History   Socioeconomic History  . Marital status: Married    Spouse name: Not on file  . Number of children: Not on file  . Years of education: Not on file  . Highest education level: Not on file  Occupational History  . Not on file  Social Needs  . Financial resource strain: Not on file  . Food insecurity:    Worry: Not on file    Inability: Not on file  . Transportation needs:    Medical: Not on file    Non-medical: Not on file  Tobacco Use  . Smoking status:  Former Research scientist (life sciences)  . Smokeless tobacco: Never Used  Substance and Sexual Activity  . Alcohol use: No    Alcohol/week: 0.0 standard drinks  . Drug use: No  . Sexual activity: Yes    Birth control/protection: Pill  Lifestyle  . Physical activity:    Days per week: Not on file    Minutes per session: Not on file  . Stress: Not on file  Relationships  . Social connections:    Talks on phone: Not on file    Gets together: Not on file    Attends religious service: Not on file    Active member of club or organization: Not on file    Attends meetings of clubs or organizations: Not on file    Relationship status: Not on file  . Intimate partner violence:    Fear of current or ex partner: Not on file    Emotionally abused: Not on file    Physically abused: Not on file    Forced sexual activity: Not on file  Other Topics Concern  . Not on file  Social History Narrative  . Not on file    No past surgical history on file.  Family History  Problem Relation Age of Onset  . Hypertension Father   . Diabetes Paternal Grandfather   . Hyperlipidemia Father   . Heart disease Father   . Uterine cancer Mother   . Breast cancer Maternal Aunt   . Pancreatic cancer Maternal Grandmother   . Colon cancer Maternal Grandfather  No Known Allergies  Current Outpatient Medications on File Prior to Visit  Medication Sig Dispense Refill  . ARMOUR THYROID 90 MG tablet TAKE 1 TABLET BY MOUTH EVERY MORNING ON AN EMPTY STOMACH WITH A FULL GLASS OF WATER. (Patient not taking: Reported on 10/06/2018) 90 tablet 1   No current facility-administered medications on file prior to visit.     BP 120/84   Pulse 83   Temp 98 F (36.7 C) (Oral)   Ht 5\' 6"  (1.676 m)   Wt 198 lb 8 oz (90 kg)   LMP 09/28/2018   SpO2 98%   BMI 32.04 kg/m    Objective:   Physical Exam  Constitutional: She is oriented to person, place, and time. She appears well-nourished.  HENT:  Mouth/Throat: No oropharyngeal exudate.   Eyes: Pupils are equal, round, and reactive to light. EOM are normal.  Neck: Neck supple. No thyromegaly present.  Cardiovascular: Normal rate and regular rhythm.  Respiratory: Effort normal and breath sounds normal.  GI: Soft. Bowel sounds are normal. There is no abdominal tenderness.  Musculoskeletal: Normal range of motion.  Neurological: She is alert and oriented to person, place, and time.  Skin: Skin is warm and dry.  Psychiatric: She has a normal mood and affect.           Assessment & Plan:

## 2018-10-06 NOTE — Patient Instructions (Signed)
Stop by the lab prior to leaving today. I will notify you of your results once received.   Continue exercising. You should be getting 150 minutes of moderate intensity exercise weekly.  Continue to work on a healthy diet. Increase vegetables, fruit, whole grains, lean protein.  Follow up with your gynecologist annually. Please send me the date of your last pap smear.  We will see you next year for your annual exam or sooner if needed.  It was a pleasure to see you today!   Preventive Care 40-64 Years, Female Preventive care refers to lifestyle choices and visits with your health care provider that can promote health and wellness. What does preventive care include?  A yearly physical exam. This is also called an annual well check.  Dental exams once or twice a year.  Routine eye exams. Ask your health care provider how often you should have your eyes checked.  Personal lifestyle choices, including: ? Daily care of your teeth and gums. ? Regular physical activity. ? Eating a healthy diet. ? Avoiding tobacco and drug use. ? Limiting alcohol use. ? Practicing safe sex. ? Taking low-dose aspirin daily starting at age 78. ? Taking vitamin and mineral supplements as recommended by your health care provider. What happens during an annual well check? The services and screenings done by your health care provider during your annual well check will depend on your age, overall health, lifestyle risk factors, and family history of disease. Counseling Your health care provider may ask you questions about your:  Alcohol use.  Tobacco use.  Drug use.  Emotional well-being.  Home and relationship well-being.  Sexual activity.  Eating habits.  Work and work Statistician.  Method of birth control.  Menstrual cycle.  Pregnancy history.  Screening You may have the following tests or measurements:  Height, weight, and BMI.  Blood pressure.  Lipid and cholesterol levels.  These may be checked every 5 years, or more frequently if you are over 41 years old.  Skin check.  Lung cancer screening. You may have this screening every year starting at age 48 if you have a 30-pack-year history of smoking and currently smoke or have quit within the past 15 years.  Fecal occult blood test (FOBT) of the stool. You may have this test every year starting at age 78.  Flexible sigmoidoscopy or colonoscopy. You may have a sigmoidoscopy every 5 years or a colonoscopy every 10 years starting at age 89.  Hepatitis C blood test.  Hepatitis B blood test.  Sexually transmitted disease (STD) testing.  Diabetes screening. This is done by checking your blood sugar (glucose) after you have not eaten for a while (fasting). You may have this done every 1-3 years.  Mammogram. This may be done every 1-2 years. Talk to your health care provider about when you should start having regular mammograms. This may depend on whether you have a family history of breast cancer.  BRCA-related cancer screening. This may be done if you have a family history of breast, ovarian, tubal, or peritoneal cancers.  Pelvic exam and Pap test. This may be done every 3 years starting at age 72. Starting at age 26, this may be done every 5 years if you have a Pap test in combination with an HPV test.  Bone density scan. This is done to screen for osteoporosis. You may have this scan if you are at high risk for osteoporosis.  Discuss your test results, treatment options, and if necessary, the  need for more tests with your health care provider. Vaccines Your health care provider may recommend certain vaccines, such as:  Influenza vaccine. This is recommended every year.  Tetanus, diphtheria, and acellular pertussis (Tdap, Td) vaccine. You may need a Td booster every 10 years.  Varicella vaccine. You may need this if you have not been vaccinated.  Zoster vaccine. You may need this after age 25.  Measles,  mumps, and rubella (MMR) vaccine. You may need at least one dose of MMR if you were born in 1957 or later. You may also need a second dose.  Pneumococcal 13-valent conjugate (PCV13) vaccine. You may need this if you have certain conditions and were not previously vaccinated.  Pneumococcal polysaccharide (PPSV23) vaccine. You may need one or two doses if you smoke cigarettes or if you have certain conditions.  Meningococcal vaccine. You may need this if you have certain conditions.  Hepatitis A vaccine. You may need this if you have certain conditions or if you travel or work in places where you may be exposed to hepatitis A.  Hepatitis B vaccine. You may need this if you have certain conditions or if you travel or work in places where you may be exposed to hepatitis B.  Haemophilus influenzae type b (Hib) vaccine. You may need this if you have certain conditions.  Talk to your health care provider about which screenings and vaccines you need and how often you need them. This information is not intended to replace advice given to you by your health care provider. Make sure you discuss any questions you have with your health care provider. Document Released: 11/04/2015 Document Revised: 06/27/2016 Document Reviewed: 08/09/2015 Elsevier Interactive Patient Education  [employer].

## 2018-10-07 DIAGNOSIS — E039 Hypothyroidism, unspecified: Secondary | ICD-10-CM

## 2018-10-07 MED ORDER — LEVOTHYROXINE SODIUM 50 MCG PO TABS: ORAL_TABLET | ORAL | 1 refills | Status: DC

## 2018-10-08 MED ORDER — LEVOTHYROXINE SODIUM 50 MCG PO TABS: ORAL_TABLET | ORAL | 1 refills | Status: DC

## 2018-11-01 ENCOUNTER — Other Ambulatory Visit: Payer: Self-pay | Admitting: Primary Care

## 2018-11-01 DIAGNOSIS — E039 Hypothyroidism, unspecified: Secondary | ICD-10-CM

## 2019-04-20 ENCOUNTER — Other Ambulatory Visit: Payer: Self-pay | Admitting: Primary Care

## 2019-04-20 DIAGNOSIS — E039 Hypothyroidism, unspecified: Secondary | ICD-10-CM

## 2019-05-01 ENCOUNTER — Other Ambulatory Visit (INDEPENDENT_AMBULATORY_CARE_PROVIDER_SITE_OTHER): Payer: BC Managed Care – PPO

## 2019-05-01 DIAGNOSIS — E039 Hypothyroidism, unspecified: Secondary | ICD-10-CM

## 2019-05-01 LAB — TSH: TSH: 8.48 u[IU]/mL — ABNORMAL HIGH (ref 0.35–4.50)

## 2019-05-01 MED ORDER — LEVOTHYROXINE SODIUM 75 MCG PO TABS: ORAL_TABLET | ORAL | 1 refills | Status: DC

## 2019-06-18 ENCOUNTER — Other Ambulatory Visit: Payer: Self-pay

## 2019-06-18 ENCOUNTER — Other Ambulatory Visit (INDEPENDENT_AMBULATORY_CARE_PROVIDER_SITE_OTHER): Payer: BC Managed Care – PPO

## 2019-06-18 DIAGNOSIS — E039 Hypothyroidism, unspecified: Secondary | ICD-10-CM | POA: Diagnosis not present

## 2019-06-18 LAB — TSH: TSH: 7.16 u[IU]/mL — ABNORMAL HIGH (ref 0.35–4.50)

## 2019-06-19 DIAGNOSIS — E039 Hypothyroidism, unspecified: Secondary | ICD-10-CM

## 2019-06-19 MED ORDER — LEVOTHYROXINE SODIUM 112 MCG PO TABS: ORAL_TABLET | ORAL | 1 refills | Status: DC

## 2019-06-24 ENCOUNTER — Other Ambulatory Visit: Payer: Self-pay | Admitting: Primary Care

## 2019-06-24 DIAGNOSIS — E039 Hypothyroidism, unspecified: Secondary | ICD-10-CM

## 2019-07-04 IMAGING — CT CT HEAD W/O CM
3 series · 15 of 47 positions shown, 18 images · non-contrast
Comparison: None.

CLINICAL DATA: Three-day history of frontal headache. Light
sensitivity. Nausea and vomiting.

EXAM:
CT HEAD WITHOUT CONTRAST
TECHNIQUE: Contiguous axial images were obtained from the base of the skull
through the vertex without intravenous contrast.

[Series 2: head 5.0 h37s · axial · 0.40mm/px · z∈[+141,+266]mm · 9 of 30 slices shown, 12 images]
[im 3/30  brain]
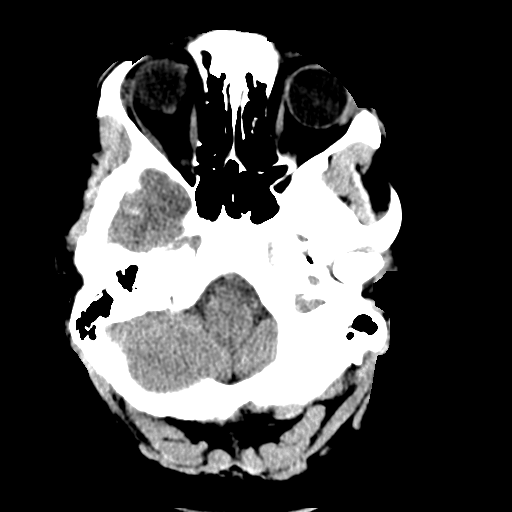
[im 3/30  bone]
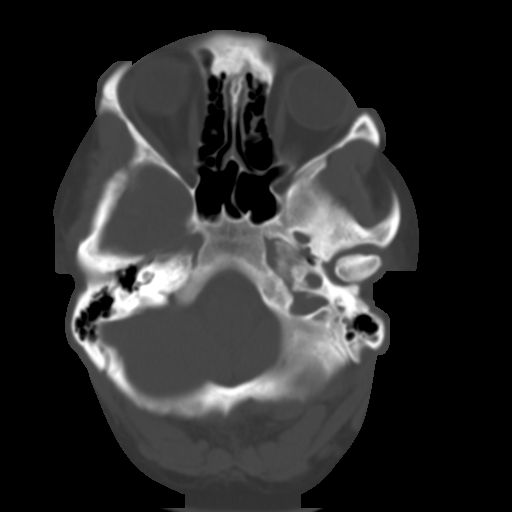
[im 6/30  brain]
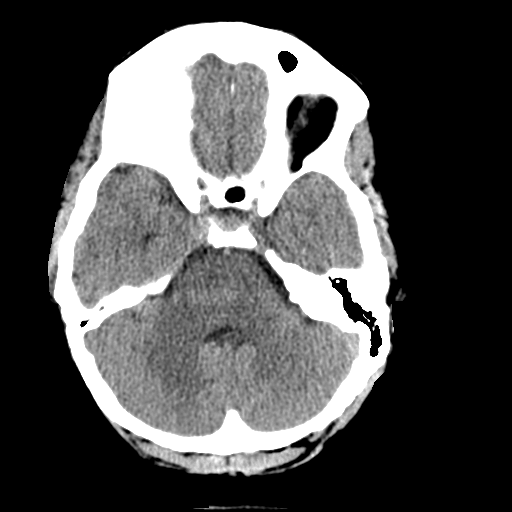
[im 9/30  brain]
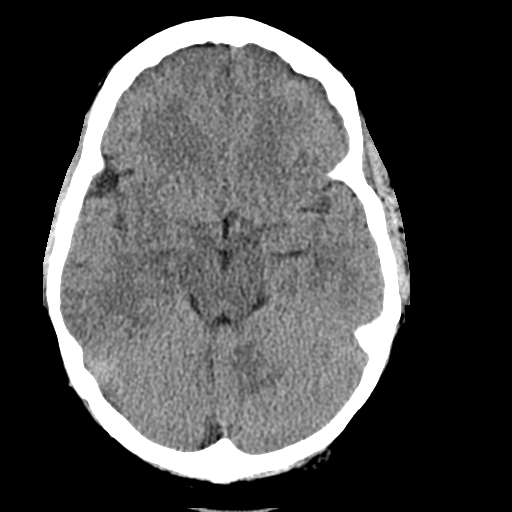
[im 12/30  brain]
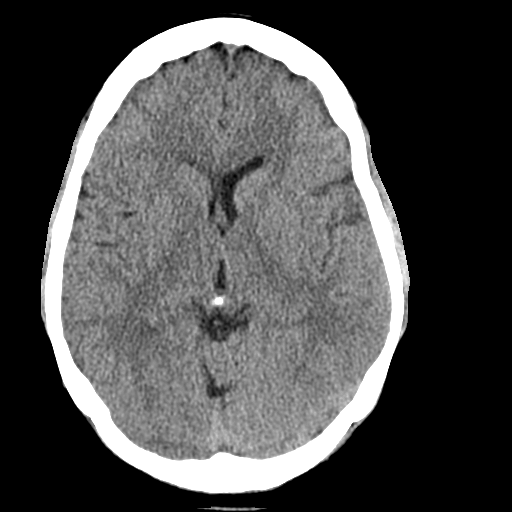
[im 16/30  brain]
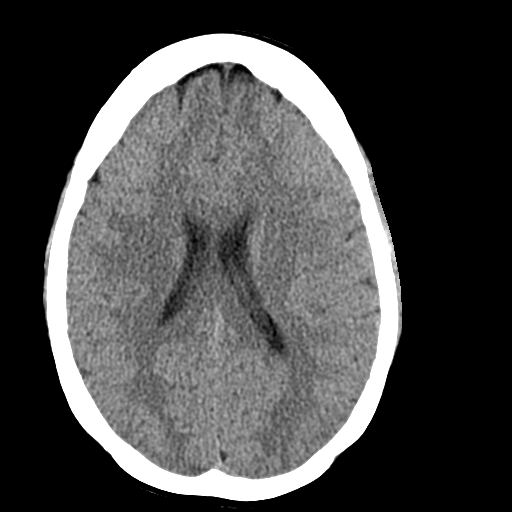
[im 16/30  bone]
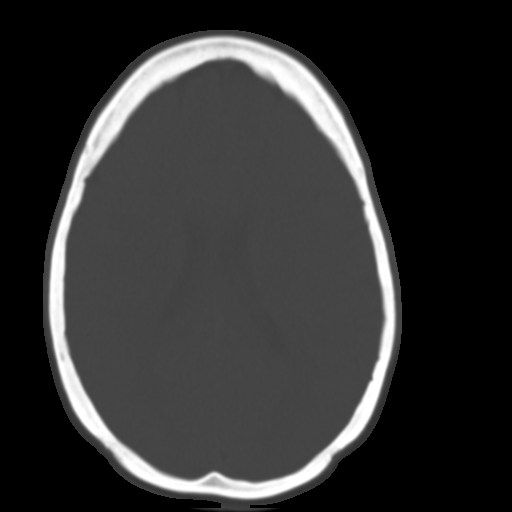
[im 19/30  brain]
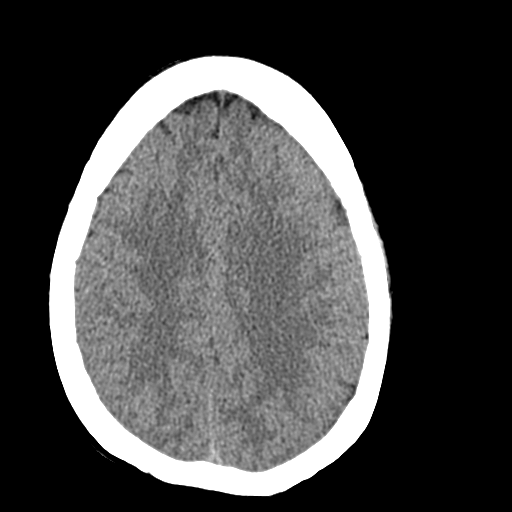
[im 22/30  brain]
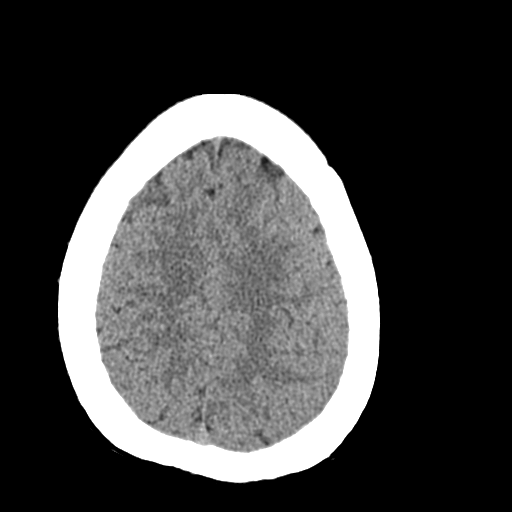
[im 25/30  brain]
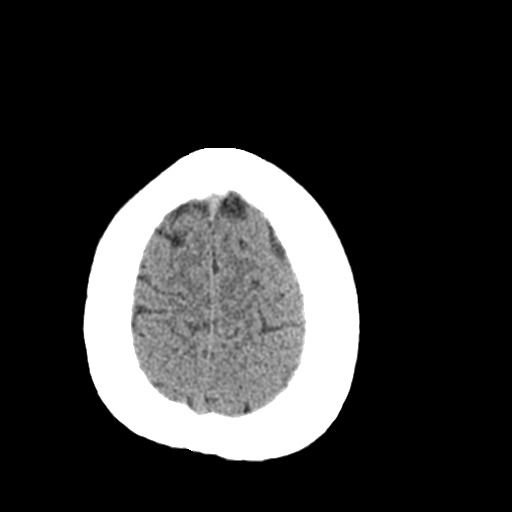
[im 28/30  brain]
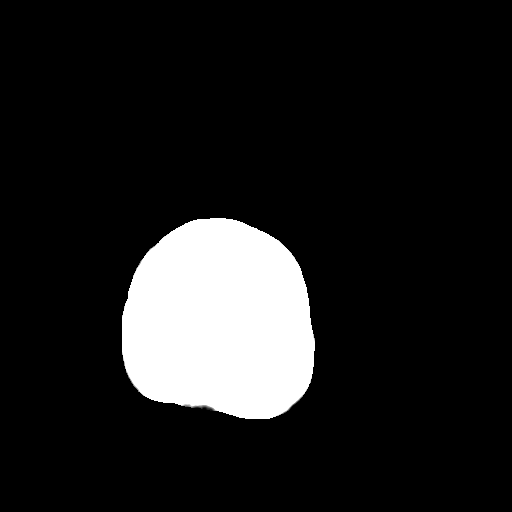
[im 28/30  bone]
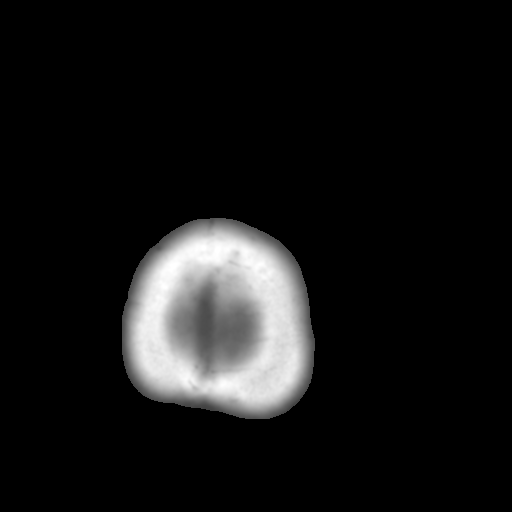

[Series 4: head 3.0 mpr cor · coronal · 0.28mm/px · 3 of 67 slices shown]
[im 23/67  brain]
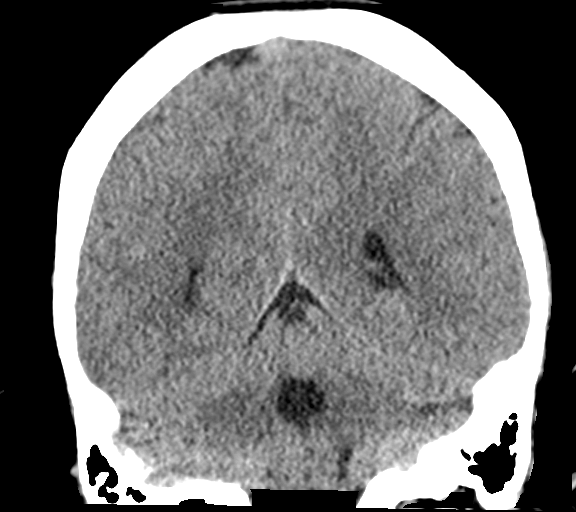
[im 30/67  brain]
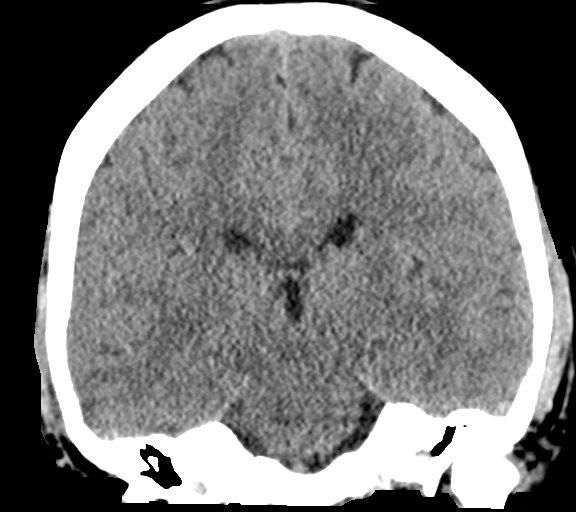
[im 37/67  brain]
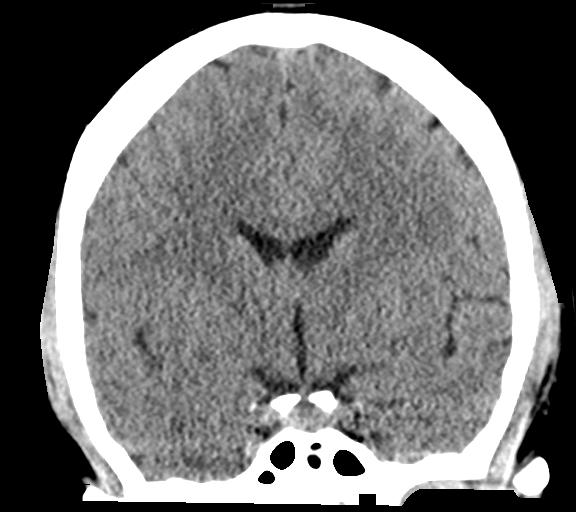

[Series 5: head 3.0 mpr sag · sagittal · 0.30mm/px · 3 of 53 slices shown]
[im 18/53  brain]
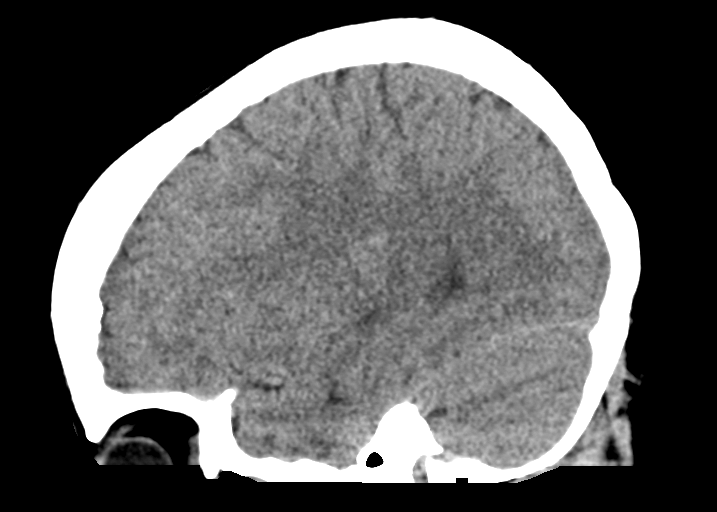
[im 27/53  brain]
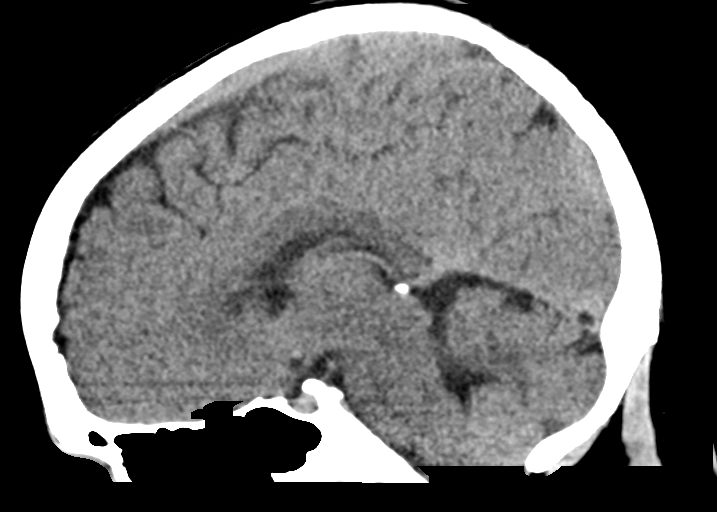
[im 35/53  brain]
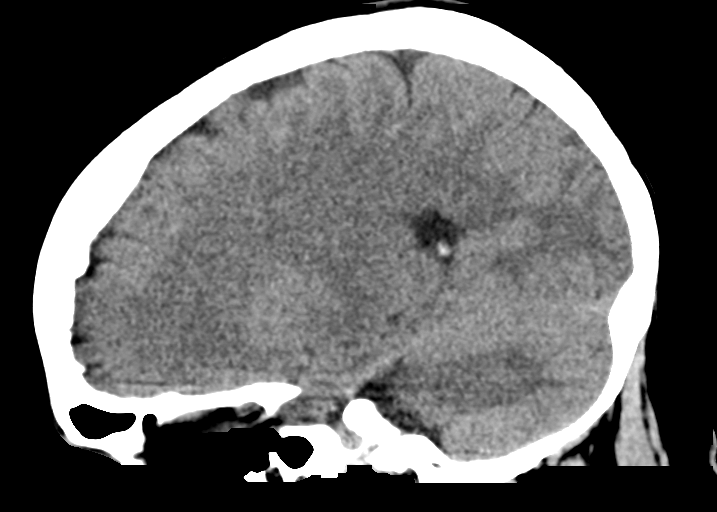

[15 of 47 positions shown; findings below may reference images not displayed]

FINDINGS: Brain: No evidence of acute infarction, hemorrhage, hydrocephalus,
extra-axial collection or mass lesion/mass effect.

Vascular: No hyperdense vessel or unexpected calcification.

Skull: Normal. Negative for fracture or focal lesion.

Sinuses/Orbits: No acute finding.

Other: None.
IMPRESSION: No cause for the patient's symptoms identified. No acute
abnormality.

## 2019-07-23 ENCOUNTER — Other Ambulatory Visit: Payer: Self-pay

## 2019-07-23 DIAGNOSIS — Z20822 Contact with and (suspected) exposure to covid-19: Secondary | ICD-10-CM

## 2019-07-24 LAB — NOVEL CORONAVIRUS, NAA: SARS-CoV-2, NAA: NOT DETECTED

## 2019-08-14 ENCOUNTER — Other Ambulatory Visit: Payer: Self-pay | Admitting: Primary Care

## 2019-08-14 DIAGNOSIS — E039 Hypothyroidism, unspecified: Secondary | ICD-10-CM

## 2019-08-20 ENCOUNTER — Other Ambulatory Visit: Payer: Self-pay | Admitting: Primary Care

## 2019-08-20 DIAGNOSIS — E039 Hypothyroidism, unspecified: Secondary | ICD-10-CM

## 2019-11-23 ENCOUNTER — Other Ambulatory Visit: Payer: Self-pay | Admitting: Primary Care

## 2019-11-23 ENCOUNTER — Telehealth: Payer: Self-pay | Admitting: Primary Care

## 2019-11-23 DIAGNOSIS — E039 Hypothyroidism, unspecified: Secondary | ICD-10-CM

## 2019-11-23 MED ORDER — LEVOTHYROXINE SODIUM 112 MCG PO TABS: ORAL_TABLET | ORAL | 0 refills | Status: DC

## 2019-11-23 NOTE — Telephone Encounter (Signed)
Patient needs to schedule an office visit since we have not seen her since 10/07/2018

## 2019-11-23 NOTE — Telephone Encounter (Signed)
Virtual 2/4 Pt aware

## 2019-11-23 NOTE — Telephone Encounter (Signed)
Pt called to schedule lab for  thyroid no order system ok to schedule??

## 2019-11-26 ENCOUNTER — Ambulatory Visit (INDEPENDENT_AMBULATORY_CARE_PROVIDER_SITE_OTHER): Payer: BC Managed Care – PPO | Admitting: Primary Care

## 2019-11-26 ENCOUNTER — Encounter: Payer: Self-pay | Admitting: Primary Care

## 2019-11-26 ENCOUNTER — Other Ambulatory Visit: Payer: Self-pay

## 2019-11-26 VITALS — Temp 95.6°F

## 2019-11-26 DIAGNOSIS — F411 Generalized anxiety disorder: Secondary | ICD-10-CM | POA: Diagnosis not present

## 2019-11-26 DIAGNOSIS — R4189 Other symptoms and signs involving cognitive functions and awareness: Secondary | ICD-10-CM

## 2019-11-26 DIAGNOSIS — F488 Other specified nonpsychotic mental disorders: Secondary | ICD-10-CM | POA: Diagnosis not present

## 2019-11-26 DIAGNOSIS — E039 Hypothyroidism, unspecified: Secondary | ICD-10-CM | POA: Diagnosis not present

## 2019-11-26 NOTE — Assessment & Plan Note (Signed)
TSH of 7 in August 2020, repeat TSH pending. She is compliant to levothyroxine 112 mcg and is taking correctly.

## 2019-11-26 NOTE — Patient Instructions (Signed)
Call the main line to schedule your lab appointment.  Be sure to take your levothyroxine (thyroid medication) every morning on an empty stomach with water only. No food or other medications for 30 minutes. No heartburn medication, iron pills, calcium, vitamin D, or magnesium pills within four hours of taking levothyroxine.   It was a pleasure to see you today! [name], NP-C
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
## 2019-11-26 NOTE — Assessment & Plan Note (Signed)
Overall improved, very rare instances of anxiety while in the car. Continue to monitor.

## 2019-11-27 ENCOUNTER — Other Ambulatory Visit (INDEPENDENT_AMBULATORY_CARE_PROVIDER_SITE_OTHER): Payer: BC Managed Care – PPO

## 2019-11-27 DIAGNOSIS — R4189 Other symptoms and signs involving cognitive functions and awareness: Secondary | ICD-10-CM

## 2019-11-27 DIAGNOSIS — E039 Hypothyroidism, unspecified: Secondary | ICD-10-CM | POA: Diagnosis not present

## 2019-11-27 DIAGNOSIS — F488 Other specified nonpsychotic mental disorders: Secondary | ICD-10-CM | POA: Diagnosis not present

## 2019-11-27 LAB — COMPREHENSIVE METABOLIC PANEL
ALT: 13 U/L (ref 0–35)
AST: 15 U/L (ref 0–37)
Albumin: 4 g/dL (ref 3.5–5.2)
Alkaline Phosphatase: 69 U/L (ref 39–117)
BUN: 13 mg/dL (ref 6–23)
CO2: 27 mEq/L (ref 19–32)
Calcium: 9.2 mg/dL (ref 8.4–10.5)
Chloride: 104 mEq/L (ref 96–112)
Creatinine, Ser: 0.9 mg/dL (ref 0.40–1.20)
GFR: 67.04 mL/min (ref >60.00)
Glucose, Bld: 83 mg/dL (ref 70–99)
Potassium: 3.9 mEq/L (ref 3.5–5.1)
Sodium: 138 mEq/L (ref 135–145)
Total Bilirubin: 0.3 mg/dL (ref 0.2–1.2)
Total Protein: 6.8 g/dL (ref 6.0–8.3)

## 2019-11-27 LAB — TSH: TSH: 1.74 u[IU]/mL (ref 0.35–4.50)

## 2019-11-27 LAB — VITAMIN B12: Vitamin B-12: 436 pg/mL (ref 211–911)

## 2020-02-24 ENCOUNTER — Other Ambulatory Visit: Payer: Self-pay | Admitting: Primary Care

## 2020-02-24 DIAGNOSIS — E039 Hypothyroidism, unspecified: Secondary | ICD-10-CM

## 2020-06-22 ENCOUNTER — Ambulatory Visit: Payer: BC Managed Care – PPO | Admitting: Internal Medicine

## 2020-06-22 ENCOUNTER — Ambulatory Visit: Payer: BC Managed Care – PPO | Admitting: Primary Care

## 2020-06-24 ENCOUNTER — Ambulatory Visit: Payer: BC Managed Care – PPO | Admitting: Family Medicine

## 2020-06-24 ENCOUNTER — Encounter: Payer: Self-pay | Admitting: Primary Care

## 2020-06-24 ENCOUNTER — Other Ambulatory Visit: Payer: Self-pay

## 2020-06-24 ENCOUNTER — Ambulatory Visit (INDEPENDENT_AMBULATORY_CARE_PROVIDER_SITE_OTHER): Payer: BC Managed Care – PPO | Admitting: Primary Care

## 2020-06-24 VITALS — BP 128/82 | HR 87 | Ht 66.0 in | Wt 201.0 lb

## 2020-06-24 DIAGNOSIS — R21 Rash and other nonspecific skin eruption: Secondary | ICD-10-CM

## 2020-06-24 MED ORDER — PREDNISONE 20 MG PO TABS: ORAL_TABLET | ORAL | 0 refills | Status: DC

## 2020-06-24 NOTE — Assessment & Plan Note (Addendum)
Unclear etiology, appears to look like eczema. Rash to breast doesn't appear similar to rash on legs.  Given that she's overdue for mammogram, we will proceed with a diagnostic mammogram with bilateral ultrasounds.  Rx for oral steroid course sent to pharmacy.  She will update if no improvement.

## 2020-06-24 NOTE — Progress Notes (Signed)
Subjective:    Patient ID: Aimee Cole, female    DOB: 28-Mar-1971, 49 y.o.   MRN: [id number]  HPI  This visit occurred during the SARS-CoV-2 public health emergency.  Safety protocols were in place, including screening questions prior to the visit, additional usage of staff PPE, and extensive cleaning of exam room while observing appropriate contact time as indicated for disinfecting solutions.   Aimee Cole is a 49 year old female who presents today with a chief complaint of rash.  She believes that she developed a rash to the bilateral upper and lower extremities a few months ago. Recently the rash "moved" to the left breast with development of warmth and itching. She's applied Cortisone cream topically this week with temporary improvement. Her rash is itchy and warm mostly, denies pain.   Her last mammogram was several years ago. She denies lumps, other skin texture changes.   Review of Systems  Constitutional: Negative for fever.  Skin: Positive for color change and rash. Negative for wound.       No past medical history on file.   Social History   Socioeconomic History  . Marital status: Married    Spouse name: Not on file  . Number of children: Not on file  . Years of education: Not on file  . Highest education level: Not on file  Occupational History  . Not on file  Tobacco Use  . Smoking status: Former Research scientist (life sciences)  . Smokeless tobacco: Never Used  Substance and Sexual Activity  . Alcohol use: No    Alcohol/week: 0.0 standard drinks  . Drug use: No  . Sexual activity: Yes    Birth control/protection: Pill  Other Topics Concern  . Not on file  Social History Narrative  . Not on file   Social Determinants of Health   Financial Resource Strain:   . Difficulty of Paying Living Expenses: Not on file  Food Insecurity:   . Worried About Charity fundraiser in the Last Year: Not on file  . Ran Out of Food in the Last Year: Not on file  Transportation Needs:   .  Lack of Transportation (Medical): Not on file  . Lack of Transportation (Non-Medical): Not on file  Physical Activity:   . Days of Exercise per Week: Not on file  . Minutes of Exercise per Session: Not on file  Stress:   . Feeling of Stress : Not on file  Social Connections:   . Frequency of Communication with Friends and Family: Not on file  . Frequency of Social Gatherings with Friends and Family: Not on file  . Attends Religious Services: Not on file  . Active Member of Clubs or Organizations: Not on file  . Attends Archivist Meetings: Not on file  . Marital Status: Not on file  Intimate Partner Violence:   . Fear of Current or Ex-Partner: Not on file  . Emotionally Abused: Not on file  . Physically Abused: Not on file  . Sexually Abused: Not on file    No past surgical history on file.  Family History  Problem Relation Age of Onset  . Hypertension Father   . Diabetes Paternal Grandfather   . Hyperlipidemia Father   . Heart disease Father   . Uterine cancer Mother   . Breast cancer Maternal Aunt   . Pancreatic cancer Maternal Grandmother   . Colon cancer Maternal Grandfather     No Known Allergies  Current Outpatient Medications on File  Prior to Visit  Medication Sig Dispense Refill  . levothyroxine (SYNTHROID) 112 MCG tablet TAKE 1 TAB BY MOUTH IN MORNING ON AN EMPTY STOMACH WITH WATER.NO FOOD OR MEDICINE FOR 30 MIN 90 tablet 1   No current facility-administered medications on file prior to visit.    BP 128/82   Pulse 87   Ht 5\' 6"  (1.676 m)   Wt 201 lb (91.2 kg)   LMP 06/17/2020   SpO2 94%   BMI 32.44 kg/m    Objective:   Physical Exam Chest:     Breasts:        Right: No swelling, mass or tenderness.        Left: Skin change present. No swelling, mass or tenderness.    Skin:    General: Skin is warm and dry.     Findings: Rash present.     Comments: Erythematous slightly scaly rash to left breast.  Small red bumps to bilateral lower  extremities, very few visible.    Neurological:     Mental Status: She is alert.            Assessment & Plan:

## 2020-06-24 NOTE — Patient Instructions (Signed)
Start prednisone. Take 2 tablets daily for four days, then 1 tablet daily for four days.  You will be contacted regarding your mammogram  Please let us know if you have not been contacted within two weeks.   Please update me if no improvement in 3-4 days.  It was a pleasure to see you today!

## 2020-07-05 ENCOUNTER — Other Ambulatory Visit: Payer: BC Managed Care – PPO

## 2020-07-14 ENCOUNTER — Other Ambulatory Visit: Payer: Self-pay

## 2020-07-14 ENCOUNTER — Ambulatory Visit: Payer: BC Managed Care – PPO

## 2020-07-14 ENCOUNTER — Ambulatory Visit
Admission: RE | Admit: 2020-07-14 | Discharge: 2020-07-14 | Disposition: A | Discharge status: Home / Self Care | Payer: BC Managed Care – PPO | Source: Ambulatory Visit | Attending: Primary Care | Admitting: Primary Care

## 2020-07-14 DIAGNOSIS — R21 Rash and other nonspecific skin eruption: Secondary | ICD-10-CM

## 2020-08-08 DIAGNOSIS — R21 Rash and other nonspecific skin eruption: Secondary | ICD-10-CM

## 2020-09-01 DIAGNOSIS — Z01419 Encounter for gynecological examination (general) (routine) without abnormal findings: Secondary | ICD-10-CM | POA: Diagnosis not present

## 2020-09-01 DIAGNOSIS — N6452 Nipple discharge: Secondary | ICD-10-CM | POA: Diagnosis not present

## 2020-09-01 DIAGNOSIS — Z6832 Body mass index (BMI) 32.0-32.9, adult: Secondary | ICD-10-CM | POA: Diagnosis not present

## 2020-09-01 DIAGNOSIS — L282 Other prurigo: Secondary | ICD-10-CM | POA: Diagnosis not present

## 2020-09-01 DIAGNOSIS — R21 Rash and other nonspecific skin eruption: Secondary | ICD-10-CM | POA: Diagnosis not present

## 2020-09-01 LAB — HM PAP SMEAR

## 2020-09-02 ENCOUNTER — Other Ambulatory Visit: Payer: Self-pay | Admitting: Primary Care

## 2020-09-02 DIAGNOSIS — E039 Hypothyroidism, unspecified: Secondary | ICD-10-CM

## 2020-09-02 LAB — TSH: TSH: 22.9 — AB (ref 0.41–5.90)

## 2020-09-02 NOTE — Telephone Encounter (Signed)
Pharmacy requests refill on: Levothyroxine 112 mcg  LAST REFILL: 02/24/2020 LAST OV: 06/24/2020 NEXT OV: Not Scheduled PHARMACY: [id number] [place], [place]  Last TSH (11/27/2019): 1.74

## 2020-09-08 ENCOUNTER — Encounter: Payer: Self-pay | Admitting: Primary Care

## 2020-09-21 ENCOUNTER — Telehealth: Payer: Self-pay | Admitting: Primary Care

## 2020-09-21 NOTE — Telephone Encounter (Signed)
Called patient office visit made. Will call if any issues.

## 2020-09-21 NOTE — Telephone Encounter (Signed)
Please notify patient that we received her recent thyroid level from Dr. Julien Girt. I need to see her for follow up to discuss this reading and her current dose of levothyroxine.  This can be virtual if she prefers, but we will need to repeat thyroid testing.

## 2020-09-30 ENCOUNTER — Telehealth: Payer: Self-pay

## 2020-09-30 ENCOUNTER — Ambulatory Visit: Payer: BC Managed Care – PPO | Admitting: Primary Care

## 2020-09-30 ENCOUNTER — Other Ambulatory Visit: Payer: Self-pay

## 2020-09-30 ENCOUNTER — Encounter: Payer: Self-pay | Admitting: Primary Care

## 2020-09-30 ENCOUNTER — Ambulatory Visit (INDEPENDENT_AMBULATORY_CARE_PROVIDER_SITE_OTHER): Payer: 59 | Admitting: Primary Care

## 2020-09-30 VITALS — BP 136/82 | HR 92 | Temp 98.1°F | Ht 66.0 in | Wt 200.0 lb

## 2020-09-30 DIAGNOSIS — E039 Hypothyroidism, unspecified: Secondary | ICD-10-CM

## 2020-09-30 MED ORDER — SYNTHROID 112 MCG PO TABS: ORAL_TABLET | ORAL | 0 refills | Status: DC

## 2020-09-30 NOTE — Assessment & Plan Note (Signed)
Uncontrolled as of last month, has resumed levothyroxine 112 about one month ago.  She is taking levothyroxine correctly.   Will switch to brand Synthroid, keep at 112 mcg dose.  Will repeat TSH in 6 weeks.

## 2020-09-30 NOTE — Telephone Encounter (Signed)
Per pts chart review tab pt has already had appt today.

## 2020-09-30 NOTE — Progress Notes (Signed)
Subjective:    Patient ID: Aimee Cole, female    DOB: 02-13-1971, 49 y.o.   MRN: [id number]  HPI  This visit occurred during the SARS-CoV-2 public health emergency.  Safety protocols were in place, including screening questions prior to the visit, additional usage of staff PPE, and extensive cleaning of exam room while observing appropriate contact time as indicated for disinfecting solutions.   Aimee Cole is a 49 year old female with a history of hypothyroidism, GAD, elevated blood pressure reading who presents today to follow up regarding thyroid function.  TSH of 22 on 09/01/20. Prior to this she developed a rash in September 2021. In an attempt to to find the etiology for the rash she stopped taking her levothyroxine and was off for about 6 weeks. She resumed about one month ago, started with 1/2 tablet, then increased to 1 full tablet.   She felt so well when she was off of levothyroxine, she felt more excitement/happiness, had no chest pressure, more energy. Since resuming levothyroxine one month ago she's felt her chest pressure, not feeling as excited, she hasn't noticed fatigue return. She has had a change in occupation in August 2021 and this has helped with overall mood.   She was once on Armour Thyroid, did not feel well and also felt like it was ineffective. She's never tried Synthroid brand. Her mother is on brand Synthroid. TSH in February 2021 of 1.74 on 112 mcg of levothyroxine.   She is taking levothyroxine first thing in the morning, on an empty stomach, with water only. No food or other medications for 30 minutes. No PPI, vitamins, calcium within 4 hours.  BP Readings from Last 3 Encounters:  09/30/20 136/82  06/24/20 128/82  10/06/18 120/84     Review of Systems  Constitutional: Negative for fatigue.  Respiratory: Positive for chest tightness.   Cardiovascular: Negative for palpitations.  Skin:       Rash has improved  Neurological: Negative for headaches.        History reviewed. No pertinent past medical history.   Social History   Socioeconomic History  . Marital status: Married    Spouse name: Not on file  . Number of children: Not on file  . Years of education: Not on file  . Highest education level: Not on file  Occupational History  . Not on file  Tobacco Use  . Smoking status: Former Research scientist (life sciences)  . Smokeless tobacco: Never Used  Substance and Sexual Activity  . Alcohol use: No    Alcohol/week: 0.0 standard drinks  . Drug use: No  . Sexual activity: Yes    Birth control/protection: Pill  Other Topics Concern  . Not on file  Social History Narrative  . Not on file   Social Determinants of Health   Financial Resource Strain: Not on file  Food Insecurity: Not on file  Transportation Needs: Not on file  Physical Activity: Not on file  Stress: Not on file  Social Connections: Not on file  Intimate Partner Violence: Not on file    History reviewed. No pertinent surgical history.  Family History  Problem Relation Age of Onset  . Uterine cancer Mother   . Hypertension Father   . Hyperlipidemia Father   . Heart disease Father   . Diabetes Paternal Grandfather   . Breast cancer Maternal Aunt   . Pancreatic cancer Maternal Grandmother   . Colon cancer Maternal Grandfather     No Known Allergies  Current Outpatient  Medications on File Prior to Visit  Medication Sig Dispense Refill  . nystatin-triamcinolone ointment (MYCOLOG) nystatin-triamcinolone 100,000 unit/gram-0.1 % topical ointment     No current facility-administered medications on file prior to visit.    BP 136/82   Pulse 92   Temp 98.1 F (36.7 C) (Temporal)   Ht 5\' 6"  (1.676 m)   Wt 200 lb (90.7 kg)   SpO2 97%   BMI 32.28 kg/m    Objective:   Physical Exam Constitutional:      Appearance: She is well-nourished.  Neck:     Thyroid: No thyroid mass, thyromegaly or thyroid tenderness.  Cardiovascular:     Rate and Rhythm: Normal rate and  regular rhythm.  Musculoskeletal:     Cervical back: Neck supple.  Skin:    General: Skin is warm and dry.  Psychiatric:        Mood and Affect: Mood and affect normal.            Assessment & Plan:

## 2020-09-30 NOTE — Telephone Encounter (Signed)
[place] Night - Client Nonclinical Telephone Record Access[facility]urse Client [place] Night - Client Client Site [place] Physician Alma Friendly - NP Contact Type Call Who Is Calling Patient / Member / Family / Caregiver Caller Name [name] Caller Phone Number [phone] Patient Name Aimee Cole Patient DOB 10-02-1971 Call Type Message Only Information Provided Reason for Call Request for General Office Information Initial Comment Caller states she's there for her 820AM. Disp. Time Disposition Final User 09/30/2020 7:53:33 AM General Information Provided Yes Baruch Goldmann Call Closed By: Baruch Goldmann Transaction Date/Time: 09/30/2020 7:52:02 AM (ET)

## 2020-09-30 NOTE — Patient Instructions (Addendum)
Be sure to take your (thyroid medication) every morning on an empty stomach with water only. No food or other medications for 30 minutes. No heartburn medication, iron pills, calcium, vitamin D, or magnesium pills within four hours of taking levothyroxine.   Start Synthroid Brand 112 mcg.  Set up a lab appointment for 6 weeks to repeat thyroid function.  It was a pleasure to see you today!

## 2020-10-29 ENCOUNTER — Encounter: Payer: Self-pay | Admitting: Primary Care

## 2020-11-11 ENCOUNTER — Other Ambulatory Visit: Payer: 59

## 2020-11-17 ENCOUNTER — Other Ambulatory Visit: Payer: Self-pay

## 2020-11-17 ENCOUNTER — Other Ambulatory Visit (INDEPENDENT_AMBULATORY_CARE_PROVIDER_SITE_OTHER): Payer: 59

## 2020-11-17 DIAGNOSIS — E039 Hypothyroidism, unspecified: Secondary | ICD-10-CM

## 2020-11-17 LAB — TSH: TSH: 6.58 u[IU]/mL — ABNORMAL HIGH (ref 0.35–4.50)

## 2020-11-18 DIAGNOSIS — E039 Hypothyroidism, unspecified: Secondary | ICD-10-CM

## 2020-11-21 ENCOUNTER — Other Ambulatory Visit: Payer: Self-pay | Admitting: Primary Care

## 2020-11-21 MED ORDER — SYNTHROID 125 MCG PO TABS: ORAL_TABLET | ORAL | 0 refills | Status: DC

## 2021-03-03 ENCOUNTER — Telehealth: Payer: Self-pay

## 2021-03-03 DIAGNOSIS — E039 Hypothyroidism, unspecified: Secondary | ICD-10-CM

## 2021-03-03 NOTE — Telephone Encounter (Signed)
Pt left v/m requesting refill on synthroid and wanting to know if needed appt prior to refilling med. See pt message on 11/18/20.

## 2021-03-03 NOTE — Telephone Encounter (Signed)
She really needs a repeat TSH before we refill to ensure she's on the right dose. Does she have enough medication to last until she can get her lab done?  Please set up a lab appointment.

## 2021-03-06 ENCOUNTER — Other Ambulatory Visit (INDEPENDENT_AMBULATORY_CARE_PROVIDER_SITE_OTHER): Payer: 59

## 2021-03-06 ENCOUNTER — Other Ambulatory Visit: Payer: Self-pay

## 2021-03-06 DIAGNOSIS — E039 Hypothyroidism, unspecified: Secondary | ICD-10-CM

## 2021-03-06 LAB — TSH: TSH: 3.07 u[IU]/mL (ref 0.35–4.50)

## 2021-03-06 NOTE — Telephone Encounter (Signed)
Called patient has enough for this week. Set up app to have TSH done today and order has been placed. Will need refill called in once we receive lab results.

## 2021-03-07 DIAGNOSIS — E039 Hypothyroidism, unspecified: Secondary | ICD-10-CM

## 2021-03-07 MED ORDER — SYNTHROID 125 MCG PO TABS: ORAL_TABLET | ORAL | 1 refills | Status: DC

## 2021-03-07 NOTE — Telephone Encounter (Signed)
Received lab result and TSH has stabilized, refills sent to pharmacy.

## 2021-03-08 ENCOUNTER — Other Ambulatory Visit: Payer: Self-pay

## 2021-03-08 MED ORDER — SYNTHROID 125 MCG PO TABS
ORAL_TABLET | ORAL | 1 refills | Status: DC
  Filled 2021-03-08: qty 90, 90d supply, fill #0
  Filled 2021-06-22: qty 90, 90d supply, fill #1

## 2021-03-09 ENCOUNTER — Other Ambulatory Visit: Payer: Self-pay

## 2021-06-22 ENCOUNTER — Other Ambulatory Visit: Payer: Self-pay

## 2021-09-22 ENCOUNTER — Other Ambulatory Visit: Payer: Self-pay

## 2021-09-22 ENCOUNTER — Other Ambulatory Visit: Payer: Self-pay | Admitting: Primary Care

## 2021-09-22 DIAGNOSIS — E039 Hypothyroidism, unspecified: Secondary | ICD-10-CM

## 2021-09-22 MED ORDER — SYNTHROID 125 MCG PO TABS
ORAL_TABLET | ORAL | 0 refills | Status: DC
  Filled 2021-09-22: qty 30, 30d supply, fill #0

## 2021-09-25 ENCOUNTER — Other Ambulatory Visit: Payer: Self-pay

## 2021-09-26 DIAGNOSIS — Z1231 Encounter for screening mammogram for malignant neoplasm of breast: Secondary | ICD-10-CM | POA: Diagnosis not present

## 2021-09-26 DIAGNOSIS — Z6828 Body mass index (BMI) 28.0-28.9, adult: Secondary | ICD-10-CM | POA: Diagnosis not present

## 2021-09-26 DIAGNOSIS — Z01419 Encounter for gynecological examination (general) (routine) without abnormal findings: Secondary | ICD-10-CM | POA: Diagnosis not present

## 2021-10-02 LAB — HM PAP SMEAR: HPV, high-risk: NEGATIVE

## 2021-10-24 ENCOUNTER — Telehealth: Payer: Self-pay | Admitting: Primary Care

## 2021-10-24 NOTE — Telephone Encounter (Signed)
Looks like patient has not been seen in over a year. Will need office visit and we can do labs at that time. I have called and l/m to call office for appointment change. Please try to give her a call to set that up.

## 2021-10-24 NOTE — Telephone Encounter (Signed)
LMTCB to cancel lab and schedule med follow up

## 2021-10-24 NOTE — Telephone Encounter (Signed)
Aimee Cole called in and wanted to know abuout getting labs done to get her meds refilled. But she went to the ob and they asked if she can get the metabolic panel done as weel. [name] is coming Thursday 1/5 at 930

## 2021-10-26 ENCOUNTER — Other Ambulatory Visit: Payer: Self-pay | Admitting: Primary Care

## 2021-10-26 ENCOUNTER — Other Ambulatory Visit: Payer: 59

## 2021-11-01 NOTE — Telephone Encounter (Signed)
, °

## 2021-11-02 ENCOUNTER — Encounter: Payer: Self-pay | Admitting: Primary Care

## 2021-11-02 ENCOUNTER — Other Ambulatory Visit: Payer: Self-pay

## 2021-11-02 ENCOUNTER — Ambulatory Visit: Payer: 59 | Admitting: Primary Care

## 2021-11-02 ENCOUNTER — Other Ambulatory Visit: Payer: Self-pay | Admitting: Primary Care

## 2021-11-02 VITALS — BP 132/74 | HR 74 | Temp 97.6°F | Ht 66.0 in | Wt 176.0 lb

## 2021-11-02 DIAGNOSIS — Z1159 Encounter for screening for other viral diseases: Secondary | ICD-10-CM | POA: Diagnosis not present

## 2021-11-02 DIAGNOSIS — F411 Generalized anxiety disorder: Secondary | ICD-10-CM | POA: Diagnosis not present

## 2021-11-02 DIAGNOSIS — E039 Hypothyroidism, unspecified: Secondary | ICD-10-CM

## 2021-11-02 DIAGNOSIS — Z114 Encounter for screening for human immunodeficiency virus [HIV]: Secondary | ICD-10-CM

## 2021-11-02 LAB — COMPREHENSIVE METABOLIC PANEL
ALT: 10 U/L (ref 0–35)
AST: 14 U/L (ref 0–37)
Albumin: 4.2 g/dL (ref 3.5–5.2)
Alkaline Phosphatase: 45 U/L (ref 39–117)
BUN: 17 mg/dL (ref 6–23)
CO2: 27 mEq/L (ref 19–32)
Calcium: 9.3 mg/dL (ref 8.4–10.5)
Chloride: 105 mEq/L (ref 96–112)
Creatinine, Ser: 0.91 mg/dL (ref 0.40–1.20)
GFR: 74.25 mL/min (ref >60.00)
Glucose, Bld: 86 mg/dL (ref 70–99)
Potassium: 4.8 mEq/L (ref 3.5–5.1)
Sodium: 138 mEq/L (ref 135–145)
Total Bilirubin: 0.4 mg/dL (ref 0.2–1.2)
Total Protein: 6.7 g/dL (ref 6.0–8.3)

## 2021-11-02 LAB — CBC
HCT: 36.3 % (ref 36.0–46.0)
Hemoglobin: 11.1 g/dL — ABNORMAL LOW (ref 12.0–15.0)
MCHC: 30.5 g/dL (ref 30.0–36.0)
MCV: 72.4 fl — ABNORMAL LOW (ref 78.0–100.0)
Platelets: 252 10*3/uL (ref 150.0–400.0)
RBC: 5.01 Mil/uL (ref 3.87–5.11)
RDW: 19.8 % — ABNORMAL HIGH (ref 11.5–15.5)
WBC: 5.1 10*3/uL (ref 4.0–10.5)

## 2021-11-02 LAB — LIPID PANEL
Cholesterol: 168 mg/dL (ref 0–200)
HDL: 65.4 mg/dL (ref >39.00)
LDL Cholesterol: 93 mg/dL (ref 0–99)
NonHDL: 102.94
Total CHOL/HDL Ratio: 3
Triglycerides: 51 mg/dL (ref 0.0–149.0)
VLDL: 10.2 mg/dL (ref 0.0–40.0)

## 2021-11-02 LAB — TSH: TSH: 4.01 u[IU]/mL (ref 0.35–5.50)

## 2021-11-02 MED ORDER — SYNTHROID 125 MCG PO TABS
ORAL_TABLET | ORAL | 3 refills | Status: DC
  Filled 2021-11-02: qty 90, 90d supply, fill #0
  Filled 2022-02-01: qty 90, 90d supply, fill #1
  Filled 2022-05-25: qty 90, 90d supply, fill #2
  Filled 2022-08-28: qty 90, 90d supply, fill #3

## 2021-11-02 MED ORDER — VENLAFAXINE HCL ER 37.5 MG PO CP24
37.5000 mg | ORAL_CAPSULE | Freq: Every day | ORAL | 1 refills | Status: DC
  Filled 2021-11-02: qty 30, 30d supply, fill #0
  Filled 2021-12-11: qty 30, 30d supply, fill #1

## 2021-11-02 NOTE — Telephone Encounter (Signed)
Patient was seen in office today. Have addressed at that visit.

## 2021-11-02 NOTE — Assessment & Plan Note (Signed)
Chronic, deteriorated.  Failed Zoloft, Lexapro, Wellbutrin. Trial of venlafaxine ER 37.5 mg sent to pharmacy. She declines therapy.  Follow up in 6 weeks virtually.

## 2021-11-02 NOTE — Patient Instructions (Addendum)
Stop by the lab prior to leaving today. I will notify you of your results once received.   Start venlafaxine ER 37.5 mg once daily with food for anxiety.  We will meet back up in 6 weeks virtually for follow up.  It was a pleasure to see you today!

## 2021-11-02 NOTE — Progress Notes (Signed)
Subjective:    Patient ID: Aimee Cole, female    DOB: January 14, 1971, 51 y.o.   MRN: [id number]  HPI  Aimee Cole is a very pleasant 51 y.o. female with a history of hypothyroidism, GAD, elevated BP reading who presents today for follow up of chronic conditions.  Follows with GYN who completes health maintenance and CPE's. She would like a full panel of labs including lipids, CMP, etc.   Managed on Synthroid 125 mcg for which she takes every morning on an empty stomach with water only. She does not eat or take other medications for 30-60 min. She takes vitamins at bedtime.  Chronic anxiety for years, overall had been managing anxiety on her own until a few weeks ago. She's tried numerous medications historically including Wellbutrin, Lexapro, Zoloft but these medications were not effective. Symptoms include "feeling paralyzed in fear", some panic attacks, worrying.    Review of Systems  Respiratory:  Negative for shortness of breath.   Cardiovascular:  Negative for chest pain.  Gastrointestinal:  Negative for constipation and diarrhea.  Skin:  Negative for rash.  Allergic/Immunologic: Negative for environmental allergies.  Psychiatric/Behavioral:  The patient is nervous/anxious.         History reviewed. No pertinent past medical history.  Social History   Socioeconomic History   Marital status: Married    Spouse name: Not on file   Number of children: Not on file   Years of education: Not on file   Highest education level: Not on file  Occupational History   Not on file  Tobacco Use   Smoking status: Former   Smokeless tobacco: Never  Substance and Sexual Activity   Alcohol use: No    Alcohol/week: 0.0 standard drinks   Drug use: No   Sexual activity: Yes    Birth control/protection: Pill  Other Topics Concern   Not on file  Social History Narrative   Not on file   Social Determinants of Health   Financial Resource Strain: Not on file  Food Insecurity:  Not on file  Transportation Needs: Not on file  Physical Activity: Not on file  Stress: Not on file  Social Connections: Not on file  Intimate Partner Violence: Not on file    History reviewed. No pertinent surgical history.  Family History  Problem Relation Age of Onset   Uterine cancer Mother    Hypertension Father    Hyperlipidemia Father    Heart disease Father    Diabetes Paternal Grandfather    Breast cancer Maternal Aunt    Pancreatic cancer Maternal Grandmother    Colon cancer Maternal Grandfather     No Known Allergies  Current Outpatient Medications on File Prior to Visit  Medication Sig Dispense Refill   SYNTHROID 125 MCG tablet Take 1 tablet by mouth every morning on an empty stomach with water only.  No food or other medications for 30 minutes. Office visit required for further refills. 30 tablet 0   No current facility-administered medications on file prior to visit.    BP 132/74    Pulse 74    Temp 97.6 F (36.4 C) (Temporal)    Ht 5\' 6"  (1.676 m)    Wt 176 lb (79.8 kg)    SpO2 100%    BMI 28.41 kg/m  Objective:   Physical Exam Cardiovascular:     Rate and Rhythm: Normal rate and regular rhythm.  Pulmonary:     Effort: Pulmonary effort is normal.  Breath sounds: Normal breath sounds.  Abdominal:     General: Bowel sounds are normal.     Palpations: Abdomen is soft.     Tenderness: There is no abdominal tenderness.  Musculoskeletal:     Cervical back: Neck supple.  Skin:    General: Skin is warm and dry.          Assessment & Plan:      This visit occurred during the SARS-CoV-2 public health emergency.  Safety protocols were in place, including screening questions prior to the visit, additional usage of staff PPE, and extensive cleaning of exam room while observing appropriate contact time as indicated for disinfecting solutions.

## 2021-11-02 NOTE — Assessment & Plan Note (Signed)
She is taking Synthroid 125 mcg correctly, continue same. Repeat TSH pending.

## 2021-11-03 LAB — HEPATITIS C ANTIBODY
Hepatitis C Ab: NONREACTIVE
SIGNAL TO CUT-OFF: 0.11 (ref <1.00)

## 2021-11-03 LAB — HIV ANTIBODY (ROUTINE TESTING W REFLEX): HIV 1&2 Ab, 4th Generation: NONREACTIVE

## 2021-12-06 ENCOUNTER — Other Ambulatory Visit: Payer: Self-pay

## 2021-12-11 ENCOUNTER — Other Ambulatory Visit: Payer: Self-pay

## 2021-12-14 ENCOUNTER — Encounter: Payer: Self-pay | Admitting: Primary Care

## 2021-12-14 ENCOUNTER — Other Ambulatory Visit: Payer: Self-pay

## 2021-12-14 ENCOUNTER — Telehealth (INDEPENDENT_AMBULATORY_CARE_PROVIDER_SITE_OTHER): Payer: 59 | Admitting: Primary Care

## 2021-12-14 DIAGNOSIS — F411 Generalized anxiety disorder: Secondary | ICD-10-CM

## 2021-12-14 MED ORDER — VENLAFAXINE HCL ER 37.5 MG PO CP24
37.5000 mg | ORAL_CAPSULE | Freq: Every day | ORAL | 3 refills | Status: DC
  Filled 2021-12-14 – 2022-02-01 (×2): qty 90, 90d supply, fill #0

## 2021-12-14 NOTE — Progress Notes (Signed)
Patient ID: Aimee Cole, female    DOB: 12/22/1970, 51 y.o.   MRN: [id number]  Virtual visit completed through [place], a video enabled telemedicine application. Due to national recommendations of social distancing due to COVID-19, a virtual visit is felt to be most appropriate for this patient at this time. Reviewed limitations, risks, security and privacy concerns of performing a virtual visit and the availability of in person appointments. I also reviewed that there may be a patient responsible charge related to this service. The patient agreed to proceed.   Patient location: home Provider location: [place] at [facility], office Persons participating in this virtual visit: patient, provider   If any vitals were documented, they were collected by patient at home unless specified below.    Ht 5\' 6"  (1.676 m)    Wt 176 lb (79.8 kg)    BMI 28.41 kg/m    CC: Follow up for anxiety  Subjective:   HPI: Aimee Cole is a 51 y.o. female with a history of hypothyroidism, chronic anxiety presenting on 12/14/2021 for Follow-up  She was last evaluated on 11/02/2021 for her CPE, endorsed chronic anxiety for years, unable to manage on her own moving forward.  Symptoms include "feeling paralyzed in fear", panic attacks, worrying.  Given her symptoms we decided to initiate venlafaxine ER 37.5 mg daily as she had failed Zoloft, Lexapro, Wellbutrin previously.  She declined therapy.  Since her last visit she's doing "much better". Positive effects include feeling less anxious, less worry, less fearful. She denies nausea, GI upset, headaches.    She did have an experience earlier this week where her mind felt calm but body felt like a small panic attack.  This eventually resolved after several minutes, has not happened again.      Relevant past medical, surgical, family and social history reviewed and updated as indicated. Interim medical history since our last visit reviewed. Allergies and  medications reviewed and updated. Outpatient Medications Prior to Visit  Medication Sig Dispense Refill   SYNTHROID 125 MCG tablet Take 1 tablet by mouth every morning on an empty stomach with water only.  No food or other medications for 30 minutes. 90 tablet 3   venlafaxine XR (EFFEXOR XR) 37.5 MG 24 hr capsule Take 1 capsule (37.5 mg total) by mouth daily with breakfast. For anxiety. 30 capsule 1   No facility-administered medications prior to visit.     Per HPI unless specifically indicated in ROS section below Review of Systems  Gastrointestinal:  Negative for abdominal pain and nausea.  Neurological:  Negative for headaches.  Psychiatric/Behavioral:  The patient is nervous/anxious.        See HPI  Objective:  Ht 5\' 6"  (1.676 m)    Wt 176 lb (79.8 kg)    BMI 28.41 kg/m   Wt Readings from Last 3 Encounters:  12/14/21 176 lb (79.8 kg)  11/02/21 176 lb (79.8 kg)  09/30/20 200 lb (90.7 kg)       Physical exam: General: Alert and oriented x 3, no distress, does not appear sickly  Pulmonary: Speaks in complete sentences without increased work of breathing, no cough during visit.  Psychiatric: Normal mood, thought content, and behavior.     Results for orders placed or performed in visit on 11/02/21  HIV antibody (with reflex)  Result Value Ref Range   HIV 1&2 Ab, 4th Generation NON-REACTIVE NON-REACTIVE  Hepatitis C Antibody  Result Value Ref Range   Hepatitis C Ab NON-REACTIVE  NON-REACTIVE   SIGNAL TO CUT-OFF 0.11 <1.00  Lipid panel  Result Value Ref Range   Cholesterol 168 0 - 200 mg/dL   Triglycerides 51.0 0.0 - 149.0 mg/dL   HDL 65.40 >39.00 mg/dL   VLDL 10.2 0.0 - 40.0 mg/dL   LDL Cholesterol 93 0 - 99 mg/dL   Total CHOL/HDL Ratio 3    NonHDL 102.94   Comprehensive metabolic panel  Result Value Ref Range   Sodium 138 135 - 145 mEq/L   Potassium 4.8 3.5 - 5.1 mEq/L   Chloride 105 96 - 112 mEq/L   CO2 27 19 - 32 mEq/L   Glucose, Bld 86 70 - 99 mg/dL   BUN 17  6 - 23 mg/dL   Creatinine, Ser 0.91 0.40 - 1.20 mg/dL   Total Bilirubin 0.4 0.2 - 1.2 mg/dL   Alkaline Phosphatase 45 39 - 117 U/L   AST 14 0 - 37 U/L   ALT 10 0 - 35 U/L   Total Protein 6.7 6.0 - 8.3 g/dL   Albumin 4.2 3.5 - 5.2 g/dL   GFR 74.25 >60.00 mL/min   Calcium 9.3 8.4 - 10.5 mg/dL  TSH  Result Value Ref Range   TSH 4.01 0.35 - 5.50 uIU/mL  CBC  Result Value Ref Range   WBC 5.1 4.0 - 10.5 K/uL   RBC 5.01 3.87 - 5.11 Mil/uL   Platelets 252.0 150.0 - 400.0 K/uL   Hemoglobin 11.1 (L) 12.0 - 15.0 g/dL   HCT 36.3 36.0 - 46.0 %   MCV 72.4 (L) 78.0 - 100.0 fl   MCHC 30.5 30.0 - 36.0 g/dL   RDW 19.8 (H) 11.5 - 15.5 %   Assessment & Plan:   Problem List Items Addressed This Visit       Other   Generalized anxiety disorder    Improved and overall satisfied with the results!  Continue venlafaxine ER 37.5 mg daily. Refill sent to pharmacy.      Relevant Medications   venlafaxine XR (EFFEXOR XR) 37.5 MG 24 hr capsule     Meds ordered this encounter  Medications   venlafaxine XR (EFFEXOR XR) 37.5 MG 24 hr capsule    Sig: Take 1 capsule (37.5 mg total) by mouth daily with breakfast. For anxiety.    Dispense:  90 capsule    Refill:  3    Order Specific Question:   Supervising Provider    Answer:   BEDSOLE, AMY E [[id number]]   No orders of the defined types were placed in this encounter.   I discussed the assessment and treatment plan with the patient. The patient was provided an opportunity to ask questions and all were answered. The patient agreed with the plan and demonstrated an understanding of the instructions. The patient was advised to call back or seek an in-person evaluation if the symptoms worsen or if the condition fails to improve as anticipated.  Follow up plan:  Continue venlafaxine ER 37.5 mg daily for anxiety.  It was a pleasure to see you today!   Pleas Koch, NP

## 2021-12-14 NOTE — Assessment & Plan Note (Signed)
Improved and overall satisfied with the results!  Continue venlafaxine ER 37.5 mg daily. Refill sent to pharmacy.

## 2021-12-14 NOTE — Patient Instructions (Signed)
Continue venlafaxine ER 37.5 mg daily for anxiety.  It was a pleasure to see you today!

## 2022-02-01 ENCOUNTER — Other Ambulatory Visit: Payer: Self-pay

## 2022-05-25 ENCOUNTER — Other Ambulatory Visit: Payer: Self-pay

## 2022-05-28 NOTE — Telephone Encounter (Signed)
Noted and agree with Dr. Verda Cumins recommendation

## 2022-05-30 ENCOUNTER — Ambulatory Visit
Admission: RE | Admit: 2022-05-30 | Discharge: 2022-05-30 | Disposition: A | Discharge status: Home / Self Care | Payer: 59 | Source: Ambulatory Visit | Attending: Primary Care | Admitting: Primary Care

## 2022-05-30 ENCOUNTER — Ambulatory Visit: Payer: 59 | Admitting: Primary Care

## 2022-05-30 ENCOUNTER — Encounter: Payer: Self-pay | Admitting: Primary Care

## 2022-05-30 ENCOUNTER — Other Ambulatory Visit: Payer: Self-pay

## 2022-05-30 ENCOUNTER — Other Ambulatory Visit: Payer: Self-pay | Admitting: Primary Care

## 2022-05-30 VITALS — BP 134/74 | HR 95 | Temp 97.6°F | Ht 66.0 in | Wt 185.0 lb

## 2022-05-30 DIAGNOSIS — N939 Abnormal uterine and vaginal bleeding, unspecified: Secondary | ICD-10-CM | POA: Diagnosis not present

## 2022-05-30 DIAGNOSIS — E039 Hypothyroidism, unspecified: Secondary | ICD-10-CM

## 2022-05-30 DIAGNOSIS — N83201 Unspecified ovarian cyst, right side: Secondary | ICD-10-CM | POA: Diagnosis not present

## 2022-05-30 LAB — BASIC METABOLIC PANEL
BUN: 14 mg/dL (ref 6–23)
CO2: 29 mEq/L (ref 19–32)
Calcium: 8.9 mg/dL (ref 8.4–10.5)
Chloride: 103 mEq/L (ref 96–112)
Creatinine, Ser: 0.82 mg/dL (ref 0.40–1.20)
GFR: 83.79 mL/min (ref >60.00)
Glucose, Bld: 90 mg/dL (ref 70–99)
Potassium: 4.1 mEq/L (ref 3.5–5.1)
Sodium: 137 mEq/L (ref 135–145)

## 2022-05-30 LAB — CBC
HCT: 27.2 % — ABNORMAL LOW (ref 36.0–46.0)
Hemoglobin: 8.6 g/dL — ABNORMAL LOW (ref 12.0–15.0)
MCHC: 31.7 g/dL (ref 30.0–36.0)
MCV: 75.4 fl — ABNORMAL LOW (ref 78.0–100.0)
Platelets: 239 10*3/uL (ref 150.0–400.0)
RBC: 3.6 Mil/uL — ABNORMAL LOW (ref 3.87–5.11)
RDW: 17.1 % — ABNORMAL HIGH (ref 11.5–15.5)
WBC: 4.8 10*3/uL (ref 4.0–10.5)

## 2022-05-30 MED ORDER — NORETHINDRONE ACETATE 5 MG PO TABS
5.0000 mg | ORAL_TABLET | Freq: Every day | ORAL | 0 refills | Status: DC
  Filled 2022-05-30: qty 10, 10d supply, fill #0

## 2022-05-30 NOTE — Assessment & Plan Note (Signed)
No prior history.  Obtaining stat pelvic/transvaginal US today. Checking labs.  Will consult with GYN regarding treatment for bleeding.  Await results.

## 2022-05-30 NOTE — Patient Instructions (Addendum)
You will be contacted regarding your ultrasound.  Please let us know if you have not been contacted within 24 hours.  I will be in touch once I hear back from my GYN colleague.  Stop by the lab prior to leaving today. I will notify you of your results once received.   It was a pleasure to see you today!

## 2022-05-30 NOTE — Progress Notes (Signed)
Subjective:    Patient ID: Aimee Cole, female    DOB: 12/28/1970, 51 y.o.   MRN: [id number]  HPI  Aimee Cole is a very pleasant 51 y.o. female with a history of hypothyroidism, GAD, elevated BP readings who presents today to discuss abnormal uterine bleeding.   She contacted our clinic via [place] on 05/28/22 reporting progressing abnormal uterine bleeding that began 05/07/22. She endorsed passing clots during each bathroom visit and bleeding through tampons and pads every few hours. Given this information she was advised to see her GYN or follow up with [place].  Today she continues to notice vaginal bleeding. Her clotting stopped three days ago and menses remains heavy overall. She will wake during the night 1-3 times with a saturated pads. She's noticed suprapubic pressure over the last few days. Menses as been regular except for a missed cycle in June 2023.   She believes her mother had uterine fibroids. She denies a family history of ovarian/uterine cancer. She denies fevers, abdominal pain, urinary symptoms, changes to her diet, extreme exercise, new medications, hot flashes, mood swings.    Review of Systems  Constitutional:  Positive for fatigue.  Respiratory:  Negative for shortness of breath.   Genitourinary:  Positive for menstrual problem. Negative for dysuria, flank pain and urgency.  Neurological:  Positive for light-headedness.         History reviewed. No pertinent past medical history.  Social History   Socioeconomic History   Marital status: Married    Spouse name: Not on file   Number of children: Not on file   Years of education: Not on file   Highest education level: Not on file  Occupational History   Not on file  Tobacco Use   Smoking status: Former   Smokeless tobacco: Never  Substance and Sexual Activity   Alcohol use: No    Alcohol/week: 0.0 standard drinks of alcohol   Drug use: No   Sexual activity: Yes    Birth control/protection: Pill   Other Topics Concern   Not on file  Social History Narrative   Not on file   Social Determinants of Health   Financial Resource Strain: Not on file  Food Insecurity: Not on file  Transportation Needs: Not on file  Physical Activity: Not on file  Stress: Not on file  Social Connections: Not on file  Intimate Partner Violence: Not on file    History reviewed. No pertinent surgical history.  Family History  Problem Relation Age of Onset   Uterine cancer Mother    Hypertension Father    Hyperlipidemia Father    Heart disease Father    Diabetes Paternal Grandfather    Breast cancer Maternal Aunt    Pancreatic cancer Maternal Grandmother    Colon cancer Maternal Grandfather     No Known Allergies  Current Outpatient Medications on File Prior to Visit  Medication Sig Dispense Refill   SYNTHROID 125 MCG tablet Take 1 tablet by mouth every morning on an empty stomach with water only.  No food or other medications for 30 minutes. 90 tablet 3   venlafaxine XR (EFFEXOR XR) 37.5 MG 24 hr capsule Take 1 capsule (37.5 mg total) by mouth daily with breakfast. For anxiety. (Patient not taking: Reported on 05/30/2022) 90 capsule 3   No current facility-administered medications on file prior to visit.    BP 134/74   Pulse 95   Temp 97.6 F (36.4 C) (Oral)   Ht '5\' 6"'$  (  1.676 m)   Wt 185 lb (83.9 kg)   SpO2 99%   BMI 29.86 kg/m  Objective:   Physical Exam Constitutional:      General: She is not in acute distress. Cardiovascular:     Rate and Rhythm: Normal rate and regular rhythm.  Pulmonary:     Effort: Pulmonary effort is normal.     Breath sounds: Normal breath sounds.  Abdominal:     Palpations: Abdomen is soft.     Tenderness: There is no abdominal tenderness.  Musculoskeletal:     Cervical back: Neck supple.  Skin:    General: Skin is warm and dry.           Assessment & Plan:   Problem List Items Addressed This Visit       Genitourinary   Abnormal  uterine bleeding - Primary    No prior history.  Obtaining stat pelvic/transvaginal US today. Checking labs.  Will consult with GYN regarding treatment for bleeding.  Await results.       Relevant Orders   US PELVIC COMPLETE WITH TRANSVAGINAL   CBC   Basic metabolic panel       Pleas Koch, NP

## 2022-06-11 ENCOUNTER — Other Ambulatory Visit: Payer: Self-pay

## 2022-06-11 DIAGNOSIS — N926 Irregular menstruation, unspecified: Secondary | ICD-10-CM | POA: Diagnosis not present

## 2022-06-11 DIAGNOSIS — N920 Excessive and frequent menstruation with regular cycle: Secondary | ICD-10-CM | POA: Diagnosis not present

## 2022-06-11 MED ORDER — NORETHINDRONE ACETATE 5 MG PO TABS
ORAL_TABLET | ORAL | 0 refills | Status: DC
  Filled 2022-06-11: qty 14, 14d supply, fill #0

## 2022-06-13 ENCOUNTER — Other Ambulatory Visit: Payer: Self-pay

## 2022-06-13 DIAGNOSIS — N920 Excessive and frequent menstruation with regular cycle: Secondary | ICD-10-CM | POA: Diagnosis not present

## 2022-06-13 MED ORDER — LO LOESTRIN FE 1 MG-10 MCG / 10 MCG PO TABS
ORAL_TABLET | ORAL | 0 refills | Status: DC
  Filled 2022-06-13: qty 28, 28d supply, fill #0
  Filled 2022-06-13 – 2022-06-26 (×2): qty 84, 84d supply, fill #0

## 2022-06-14 ENCOUNTER — Other Ambulatory Visit: Payer: Self-pay

## 2022-06-26 ENCOUNTER — Other Ambulatory Visit: Payer: Self-pay

## 2022-07-06 ENCOUNTER — Other Ambulatory Visit: Payer: Self-pay

## 2022-07-20 ENCOUNTER — Other Ambulatory Visit: Payer: Self-pay

## 2022-08-02 ENCOUNTER — Other Ambulatory Visit: Payer: Self-pay

## 2022-08-02 DIAGNOSIS — N920 Excessive and frequent menstruation with regular cycle: Secondary | ICD-10-CM | POA: Diagnosis not present

## 2022-08-02 MED ORDER — NORGESTIMATE-ETH ESTRADIOL 0.25-35 MG-MCG PO TABS
ORAL_TABLET | ORAL | 4 refills | Status: DC
  Filled 2022-08-02: qty 84, 84d supply, fill #0
  Filled 2022-10-19: qty 84, 84d supply, fill #1

## 2022-08-28 ENCOUNTER — Other Ambulatory Visit: Payer: Self-pay

## 2022-08-31 ENCOUNTER — Other Ambulatory Visit: Payer: Self-pay

## 2022-10-10 ENCOUNTER — Other Ambulatory Visit: Payer: Self-pay | Admitting: Primary Care

## 2022-10-10 DIAGNOSIS — E039 Hypothyroidism, unspecified: Secondary | ICD-10-CM

## 2022-10-11 ENCOUNTER — Other Ambulatory Visit: Payer: Self-pay

## 2022-10-11 ENCOUNTER — Other Ambulatory Visit: Payer: Self-pay | Admitting: Primary Care

## 2022-10-11 DIAGNOSIS — E039 Hypothyroidism, unspecified: Secondary | ICD-10-CM

## 2022-10-11 NOTE — Telephone Encounter (Signed)
Called patient, she has enough pills to last until mid January. Made follow up appointment 11/06/22

## 2022-10-11 NOTE — Telephone Encounter (Signed)
Did you call this patient? See other message from refill request.

## 2022-10-11 NOTE — Telephone Encounter (Signed)
Patient is due for follow up in mid January 2024, this will be required prior to any further refills.   Does she have enough Synthroid to last until then?

## 2022-10-12 ENCOUNTER — Other Ambulatory Visit: Payer: Self-pay

## 2022-10-12 NOTE — Telephone Encounter (Signed)
Called patient, she has enough pills to last until mid January. Made follow up appointment 11/06/22

## 2022-10-19 ENCOUNTER — Other Ambulatory Visit: Payer: Self-pay

## 2022-11-02 ENCOUNTER — Other Ambulatory Visit: Payer: Self-pay

## 2022-11-02 DIAGNOSIS — Z6830 Body mass index (BMI) 30.0-30.9, adult: Secondary | ICD-10-CM | POA: Diagnosis not present

## 2022-11-02 DIAGNOSIS — Z01419 Encounter for gynecological examination (general) (routine) without abnormal findings: Secondary | ICD-10-CM | POA: Diagnosis not present

## 2022-11-02 DIAGNOSIS — Z1231 Encounter for screening mammogram for malignant neoplasm of breast: Secondary | ICD-10-CM | POA: Diagnosis not present

## 2022-11-02 LAB — HM MAMMOGRAPHY

## 2022-11-02 MED ORDER — NORGESTIMATE-ETH ESTRADIOL 0.25-35 MG-MCG PO TABS
1.0000 | ORAL_TABLET | Freq: Every day | ORAL | 3 refills | Status: DC
  Filled 2022-11-02 – 2023-03-06 (×2): qty 84, 84d supply, fill #0
  Filled 2023-06-19: qty 84, 84d supply, fill #1

## 2022-11-06 ENCOUNTER — Ambulatory Visit: Payer: 59 | Admitting: Primary Care

## 2022-11-06 ENCOUNTER — Encounter: Payer: Self-pay | Admitting: Primary Care

## 2022-11-06 VITALS — BP 138/84 | HR 79 | Temp 97.9°F | Ht 66.0 in | Wt 190.0 lb

## 2022-11-06 DIAGNOSIS — E039 Hypothyroidism, unspecified: Secondary | ICD-10-CM | POA: Diagnosis not present

## 2022-11-06 DIAGNOSIS — F411 Generalized anxiety disorder: Secondary | ICD-10-CM | POA: Diagnosis not present

## 2022-11-06 DIAGNOSIS — N939 Abnormal uterine and vaginal bleeding, unspecified: Secondary | ICD-10-CM

## 2022-11-06 LAB — COMPREHENSIVE METABOLIC PANEL
ALT: 12 U/L (ref 0–35)
AST: 16 U/L (ref 0–37)
Albumin: 4 g/dL (ref 3.5–5.2)
Alkaline Phosphatase: 49 U/L (ref 39–117)
BUN: 16 mg/dL (ref 6–23)
CO2: 27 mEq/L (ref 19–32)
Calcium: 9.1 mg/dL (ref 8.4–10.5)
Chloride: 104 mEq/L (ref 96–112)
Creatinine, Ser: 0.93 mg/dL (ref 0.40–1.20)
GFR: 71.83 mL/min (ref >60.00)
Glucose, Bld: 90 mg/dL (ref 70–99)
Potassium: 4.4 mEq/L (ref 3.5–5.1)
Sodium: 139 mEq/L (ref 135–145)
Total Bilirubin: 0.2 mg/dL (ref 0.2–1.2)
Total Protein: 6.6 g/dL (ref 6.0–8.3)

## 2022-11-06 LAB — LIPID PANEL
Cholesterol: 193 mg/dL (ref 0–200)
HDL: 67.7 mg/dL (ref >39.00)
LDL Cholesterol: 100 mg/dL — ABNORMAL HIGH (ref 0–99)
NonHDL: 125.75
Total CHOL/HDL Ratio: 3
Triglycerides: 131 mg/dL (ref 0.0–149.0)
VLDL: 26.2 mg/dL (ref 0.0–40.0)

## 2022-11-06 LAB — TSH: TSH: 1.63 u[IU]/mL (ref 0.35–5.50)

## 2022-11-06 NOTE — Assessment & Plan Note (Signed)
She is taking Synthroid correctly.  Will switch to generic levothyroxine 125 mcg as insurance will no longer cover.   Repeat TSH pending today. Await results.

## 2022-11-06 NOTE — Assessment & Plan Note (Signed)
Controlled.  Continue off venlafaxine ER 37.5 mg for now. Continue to monitor.

## 2022-11-06 NOTE — Patient Instructions (Signed)
Stop by the lab prior to leaving today. I will notify you of your results once received.   It was a pleasure to see you today!  

## 2022-11-06 NOTE — Progress Notes (Signed)
Subjective:    Patient ID: Aimee Cole, female    DOB: 05-31-1971, 52 y.o.   MRN: [id number]  HPI  Aimee Cole is a very pleasant 53 y.o. female  has no past medical history on file. who presents today for follow up of chronic conditions.  1) Hypothyroidism: Currently managed on Synthroid 125 mcg daily.  She is taking Synthroid every morning on an empty stomach with water only.   No food or other medications for 30 minutes.   No heartburn medication, iron pills, calcium, vitamin D, or magnesium pills within four hours of taking levothyroxine.   She has felt more sluggish and easily tired since starting her OCP's for menorrhagia. Her insurance will no longer cover brand Synthroid. She is needing refills soon.  2) GAD: Controlled. No longer taking venlafaxine ER 37.5 mg daily for which she stopped >6 months ago. Overall feels well managed without this regimen.   BP Readings from Last 3 Encounters:  11/06/22 138/84  05/30/22 134/74  11/02/21 132/74       Review of Systems  Constitutional:  Positive for fatigue.  Respiratory:  Negative for shortness of breath.   Cardiovascular:  Negative for chest pain.  Psychiatric/Behavioral:  The patient is not nervous/anxious.          History reviewed. No pertinent past medical history.  Social History   Socioeconomic History   Marital status: Married    Spouse name: Not on file   Number of children: Not on file   Years of education: Not on file   Highest education level: Not on file  Occupational History   Not on file  Tobacco Use   Smoking status: Former   Smokeless tobacco: Never  Substance and Sexual Activity   Alcohol use: No    Alcohol/week: 0.0 standard drinks of alcohol   Drug use: No   Sexual activity: Yes    Birth control/protection: Pill  Other Topics Concern   Not on file  Social History Narrative   Not on file   Social Determinants of Health   Financial Resource Strain: Not on file  Food  Insecurity: Not on file  Transportation Needs: Not on file  Physical Activity: Not on file  Stress: Not on file  Social Connections: Not on file  Intimate Partner Violence: Not on file    History reviewed. No pertinent surgical history.  Family History  Problem Relation Age of Onset   Uterine cancer Mother    Hypertension Father    Hyperlipidemia Father    Heart disease Father    Diabetes Paternal Grandfather    Breast cancer Maternal Aunt    Pancreatic cancer Maternal Grandmother    Colon cancer Maternal Grandfather     No Known Allergies  Current Outpatient Medications on File Prior to Visit  Medication Sig Dispense Refill   norgestimate-ethinyl estradiol (SPRINTEC 28) 0.25-35 MG-MCG tablet Take 1 tablet by mouth daily. 84 tablet 3   SYNTHROID 125 MCG tablet Take 1 tablet by mouth every morning on an empty stomach with water only.  No food or other medications for 30 minutes. 90 tablet 3   No current facility-administered medications on file prior to visit.    BP 138/84   Pulse 79   Temp 97.9 F (36.6 C) (Temporal)   Ht '5\' 6"'$  (1.676 m)   Wt 190 lb (86.2 kg)   LMP 11/05/2022 (Exact Date)   SpO2 99%   BMI 30.67 kg/m  Objective:   Physical  Exam Cardiovascular:     Rate and Rhythm: Normal rate and regular rhythm.  Pulmonary:     Effort: Pulmonary effort is normal.     Breath sounds: Normal breath sounds.  Abdominal:     General: Bowel sounds are normal.     Palpations: Abdomen is soft.     Tenderness: There is no abdominal tenderness.  Musculoskeletal:     Cervical back: Neck supple.  Skin:    General: Skin is warm and dry.  Psychiatric:        Mood and Affect: Mood normal.           Assessment & Plan:  Hypothyroidism, unspecified type Assessment & Plan: She is taking Synthroid correctly.  Will switch to generic levothyroxine 125 mcg as insurance will no longer cover.   Repeat TSH pending today. Await results.   Orders: -     Lipid panel -      Comprehensive metabolic panel -     TSH  Abnormal uterine bleeding Assessment & Plan: Improving.  Continue Ortho-Cyclen 0.25-35 mg-mcg daily.  CBC monitored by GYN.   Generalized anxiety disorder Assessment & Plan: Controlled.  Continue off venlafaxine ER 37.5 mg for now. Continue to monitor.         Pleas Koch, NP

## 2022-11-06 NOTE — Assessment & Plan Note (Addendum)
Improving.  Continue Ortho-Cyclen 0.25-35 mg-mcg daily.  CBC monitored by GYN.

## 2022-11-07 ENCOUNTER — Other Ambulatory Visit: Payer: Self-pay

## 2022-11-07 ENCOUNTER — Other Ambulatory Visit: Payer: Self-pay | Admitting: Primary Care

## 2022-11-07 DIAGNOSIS — E039 Hypothyroidism, unspecified: Secondary | ICD-10-CM

## 2022-11-07 MED ORDER — LEVOTHYROXINE SODIUM 125 MCG PO TABS
ORAL_TABLET | ORAL | 3 refills | Status: DC
  Filled 2022-11-07: qty 90, 90d supply, fill #0
  Filled 2023-03-06: qty 90, 90d supply, fill #1
  Filled 2023-06-19: qty 90, 90d supply, fill #2

## 2022-11-08 ENCOUNTER — Other Ambulatory Visit: Payer: Self-pay

## 2022-12-20 ENCOUNTER — Other Ambulatory Visit: Payer: Self-pay

## 2023-02-05 ENCOUNTER — Encounter: Payer: Self-pay | Admitting: Internal Medicine

## 2023-02-18 ENCOUNTER — Other Ambulatory Visit: Payer: Self-pay

## 2023-02-18 ENCOUNTER — Ambulatory Visit (AMBULATORY_SURGERY_CENTER): Payer: 59 | Admitting: *Deleted

## 2023-02-18 ENCOUNTER — Encounter: Payer: Self-pay | Admitting: Internal Medicine

## 2023-02-18 VITALS — Ht 66.0 in | Wt 180.0 lb

## 2023-02-18 DIAGNOSIS — Z1211 Encounter for screening for malignant neoplasm of colon: Secondary | ICD-10-CM

## 2023-02-18 DIAGNOSIS — Z86010 Personal history of colonic polyps: Secondary | ICD-10-CM

## 2023-02-18 MED ORDER — NA SULFATE-K SULFATE-MG SULF 17.5-3.13-1.6 GM/177ML PO SOLN
1.0000 | Freq: Once | ORAL | 0 refills | Status: AC
  Filled 2023-02-18 – 2023-03-06 (×2): qty 354, 1d supply, fill #0

## 2023-02-18 NOTE — Progress Notes (Signed)
Pt's name and DOB verified at the beginning of the pre-visit.  Pt denies any issues with ambulating, sitting laying down and turning side to side driving and did not want to schedule at this time. She stated we could send her a letter to remind her to schedule closer to the time.  No egg or soy allergy known to patient  No issues known to pt with past sedation with any surgeries or procedures Pt denies having issues being intubated Patient denies ever being intubated Pt has no issues moving head neck or swallowing No FH of Malignant Hyperthermia Pt is not on diet pills Pt is not on home 02  Pt is not on blood thinners  Pt has frequent issues with constipation RN instructed pt to use Miralax per bottles instructions a week before prep days. Pt states they will Pt is not on dialysis Pt denies any upcoming cardiac testing Pt encouraged to use to use Singlecare or Goodrx to reduce cost  Patient's chart reviewed by Cathlyn Parsons CNRA prior to pre-visit and patient appropriate for the [facility].  Pre-visit completed and red dot placed by patient's name on their procedure day (on provider's schedule).  . Visit by phone Pt states weight is 180 lb Instructed pt why it is important to and  to call if they have any changes in health or new medications. Directed them to the # given and on instructions.   Pt states they will.  Instructions reviewed with pt and pt states understanding. Instructed to review again prior to procedure. Pt states they will.  Instructions sent by mail with coupon and by my chart

## 2023-03-01 ENCOUNTER — Other Ambulatory Visit: Payer: Self-pay

## 2023-03-04 ENCOUNTER — Telehealth: Payer: Self-pay | Admitting: Internal Medicine

## 2023-03-04 NOTE — Telephone Encounter (Signed)
Called patient; unable to speak with patient; left message for patient to call back to the office;  It appears that patient did not pick up prep within 7 days of order and the pharmacy has placed the RX back on the shelf;  Patient will need to contact pharmacy and ask them to re-fill the RX; Patient advised via message to call back to the office at [phone] for this information;

## 2023-03-04 NOTE — Telephone Encounter (Signed)
Patient needing prep sent into the pharmacy. Please advise.  Thank you

## 2023-03-06 ENCOUNTER — Other Ambulatory Visit: Payer: Self-pay

## 2023-03-06 NOTE — Telephone Encounter (Signed)
Patient called regarding prep medication. Advised her to call pharmacy to have them refill the medication.

## 2023-03-11 ENCOUNTER — Encounter: Payer: Self-pay | Admitting: Internal Medicine

## 2023-03-11 ENCOUNTER — Encounter: Payer: 59 | Admitting: Internal Medicine

## 2023-03-11 ENCOUNTER — Ambulatory Visit (AMBULATORY_SURGERY_CENTER): Payer: 59 | Admitting: Internal Medicine

## 2023-03-11 VITALS — BP 133/78 | HR 69 | Temp 98.6°F | Resp 17 | Ht 66.0 in | Wt 180.0 lb

## 2023-03-11 DIAGNOSIS — K635 Polyp of colon: Secondary | ICD-10-CM | POA: Diagnosis not present

## 2023-03-11 DIAGNOSIS — D122 Benign neoplasm of ascending colon: Secondary | ICD-10-CM

## 2023-03-11 DIAGNOSIS — Z1211 Encounter for screening for malignant neoplasm of colon: Secondary | ICD-10-CM | POA: Diagnosis not present

## 2023-03-11 DIAGNOSIS — E039 Hypothyroidism, unspecified: Secondary | ICD-10-CM | POA: Diagnosis not present

## 2023-03-11 MED ORDER — SODIUM CHLORIDE 0.9 % IV SOLN
500.0000 mL | Freq: Once | INTRAVENOUS | Status: DC
Start: 2023-03-11 — End: 2023-03-11

## 2023-03-11 NOTE — Progress Notes (Unsigned)
[facility] History and Physical   Primary Care Physician:  Doreene Nest, NP   Reason for Procedure:   Colon cancer screening  Plan:    colonoscopy     HPI: Aimee Cole is a 52 y.o. female for screening exam   Past Medical History:  Diagnosis Date   Anemia    Thyroid disease     Past Surgical History:  Procedure Laterality Date   WISDOM TOOTH EXTRACTION      Prior to Admission medications   Medication Sig Start Date End Date Taking? Authorizing Provider  levothyroxine (SYNTHROID) 125 MCG tablet Take 1 tablet by mouth every morning on an empty stomach with water only.  No food or other medications for 30 minutes. 11/07/22  Yes Doreene Nest, NP  Multiple Vitamin (MULTIVITAMIN) tablet Take 1 tablet by mouth daily.   Yes [provider]  norgestimate-ethinyl estradiol (SPRINTEC 28) 0.25-35 MG-MCG tablet Take 1 tablet by mouth daily. 11/02/22  Yes     Current Outpatient Medications  Medication Sig Dispense Refill   levothyroxine (SYNTHROID) 125 MCG tablet Take 1 tablet by mouth every morning on an empty stomach with water only.  No food or other medications for 30 minutes. 90 tablet 3   Multiple Vitamin (MULTIVITAMIN) tablet Take 1 tablet by mouth daily.     norgestimate-ethinyl estradiol (SPRINTEC 28) 0.25-35 MG-MCG tablet Take 1 tablet by mouth daily. 84 tablet 3   Current Facility-Administered Medications  Medication Dose Route Frequency Provider Last Rate Last Admin   0.9 %  sodium chloride infusion  500 mL Intravenous Once Iva Boop, MD        Allergies as of 03/11/2023   (No Known Allergies)    Family History  Problem Relation Age of Onset   Uterine cancer Mother    Hypertension Father    Hyperlipidemia Father    Heart disease Father    Breast cancer Maternal Aunt    Pancreatic cancer Maternal Grandmother    Colon cancer Maternal Grandfather    Diabetes Paternal Grandfather    Rectal cancer Neg Hx    Stomach cancer  Neg Hx    Colon polyps Neg Hx    Esophageal cancer Neg Hx     Social History   Socioeconomic History   Marital status: Married    Spouse name: Not on file   Number of children: Not on file   Years of education: Not on file   Highest education level: Not on file  Occupational History   Not on file  Tobacco Use   Smoking status: Former   Smokeless tobacco: Never  Vaping Use   Vaping Use: Never used  Substance and Sexual Activity   Alcohol use: No    Alcohol/week: 0.0 standard drinks of alcohol   Drug use: No   Sexual activity: Yes    Birth control/protection: Pill  Other Topics Concern   Not on file  Social History Narrative   Not on file   Social Determinants of Health   Financial Resource Strain: Not on file  Food Insecurity: Not on file  Transportation Needs: Not on file  Physical Activity: Not on file  Stress: Not on file  Social Connections: Not on file  Intimate Partner Violence: Not on file    Review of Systems:  All other review of systems negative except as mentioned in the HPI.  Physical Exam: Vital signs BP (!) 140/96   Pulse 80   Temp 98.6 F (37 C) (  Temporal)   Resp 11   Ht 5\' 6"  (1.676 m)   Wt 180 lb (81.6 kg)   SpO2 99%   BMI 29.05 kg/m   General:   Alert,  Well-developed, well-nourished, pleasant and cooperative in NAD Lungs:  Clear throughout to auscultation.   Heart:  Regular rate and rhythm; no murmurs, clicks, rubs,  or gallops. Abdomen:  Soft, nontender and nondistended. Normal bowel sounds.   Neuro/Psych:  Alert and cooperative. Normal mood and affect. A and O x 3   @Maycie Luera  Sena Slate, MD, [facility] [phone] (pager) 03/11/2023 11:53 AM@

## 2023-03-11 NOTE — Progress Notes (Signed)
Called to room to assist during endoscopic procedure.  Patient ID and intended procedure confirmed with present staff. Received instructions for my participation in the procedure from the performing physician.  

## 2023-03-11 NOTE — Op Note (Addendum)
[facility] Patient Name: Aimee Cole Procedure Date: 03/11/2023 11:46 AM MRN: [id number] Endoscopist: Iva Boop , MD, [id number] Age: 52 Referring MD:  Date of Birth: 23-Nov-1970 Gender: Female Account #: [id number] Procedure:                Colonoscopy Indications:              Screening for colorectal malignant neoplasm, This                            is the patient's first colonoscopy Medicines:                Monitored Anesthesia Care Procedure:                Pre-Anesthesia Assessment:                           - Prior to the procedure, a History and Physical                            was performed, and patient medications and                            allergies were reviewed. The patient's tolerance of                            previous anesthesia was also reviewed. The risks                            and benefits of the procedure and the sedation                            options and risks were discussed with the patient.                            All questions were answered, and informed consent                            was obtained. Prior Anticoagulants: The patient has                            taken no anticoagulant or antiplatelet agents. ASA                            Grade Assessment: II - A patient with mild systemic                            disease. After reviewing the risks and benefits,                            the patient was deemed in satisfactory condition to                            undergo the procedure.  After obtaining informed consent, the colonoscope                            was passed under direct vision. Throughout the                            procedure, the patient's blood pressure, pulse, and                            oxygen saturations were monitored continuously. The                            CF [id number] #[id number] was introduced through the anus                            and advanced to the  the cecum, identified by                            appendiceal orifice and ileocecal valve. The                            colonoscopy was performed without difficulty. The                            patient tolerated the procedure well. The quality                            of the bowel preparation was good. The ileocecal                            valve, appendiceal orifice, and rectum were                            photographed. The bowel preparation used was SUPREP                            via split dose instruction. Scope In: 12:01:30 PM Scope Out: 12:16:23 PM Scope Withdrawal Time: 0 hours 10 minutes 22 seconds  Total Procedure Duration: 0 hours 14 minutes 53 seconds  Findings:                 The perianal and digital rectal examinations were                            normal.                           A 1 to 2 mm polyp was found in the ascending colon.                            The polyp was sessile. The polyp was removed with a                            cold biopsy forceps. Resection and retrieval were  complete. Verification of patient identification                            for the specimen was done. Estimated blood loss was                            minimal.                           The exam was otherwise without abnormality on                            direct and retroflexion views. Complications:            No immediate complications. Estimated Blood Loss:     Estimated blood loss was minimal. Impression:               - One 1 to 2 mm polyp in the ascending colon,                            removed with a cold biopsy forceps. Resected and                            retrieved.                           - The examination was otherwise normal on direct                            and retroflexion views. Recommendation:           - Patient has a contact number available for                            emergencies. The signs and symptoms of  potential                            delayed complications were discussed with the                            patient. Return to normal activities tomorrow.                            Written discharge instructions were provided to the                            patient.                           - Resume previous diet.                           - Continue present medications.                           - Repeat colonoscopy is recommended. The  colonoscopy date will be determined after pathology                            results from today's exam become available for                            review.                           - She described infrequent defecation in recovery -                            1x/week - has tried prn MiraLAx - I advised                            following options: 4 prunes or 2 Kiwi fruit/day;                            Miralax at least several x a week; prn Dulcolax -                            can see [place] if that is unhelpful (no concomitant GU                            dysfx to suggest pelvic floor problems) Iva Boop, MD 03/11/2023 12:21:07 PM This report has been signed electronically.

## 2023-03-11 NOTE — Patient Instructions (Addendum)
I found and removed one tiny polyp today.  I will let you know pathology results and when to have another routine colonoscopy by mail and/or My Chart.  All else ok - normal.  I appreciate the opportunity to care for you. Iva Boop, MD, [facility]                             - Resume previous diet.                           - Continue present medications.                           - Repeat colonoscopy is recommended. The                            colonoscopy date will be determined after pathology                            results from today's exam become available for                            review.  YOU HAD AN ENDOSCOPIC PROCEDURE TODAY AT THE [facility]:   Refer to the procedure report that was given to you for any specific questions about what was found during the examination.  If the procedure report does not answer your questions, please call your gastroenterologist to clarify.  If you requested that your care partner not be given the details of your procedure findings, then the procedure report has been included in a sealed envelope for you to review at your convenience later.  YOU SHOULD EXPECT: Some feelings of bloating in the abdomen. Passage of more gas than usual.  Walking can help get rid of the air that was put into your GI tract during the procedure and reduce the bloating. If you had a lower endoscopy (such as a colonoscopy or flexible sigmoidoscopy) you may notice spotting of blood in your stool or on the toilet paper. If you underwent a bowel prep for your procedure, you may not have a normal bowel movement for a few days.  Please Note:  You might notice some irritation and congestion in your nose or some drainage.  This is from the oxygen used during your procedure.  There is no need for concern and it should clear up in a day or so.  SYMPTOMS TO REPORT IMMEDIATELY:  Following lower endoscopy (colonoscopy or flexible sigmoidoscopy):  Excessive amounts of  blood in the stool  Significant tenderness or worsening of abdominal pains  Swelling of the abdomen that is new, acute  Fever of 100F or higher   For urgent or emergent issues, a gastroenterologist can be reached at any hour by calling [phone]. Do not use MyChart messaging for urgent concerns.    DIET:  We do recommend a small meal at first, but then you may proceed to your regular diet.  Drink plenty of fluids but you should avoid alcoholic beverages for 24 hours.  ACTIVITY:  You should plan to take it easy for the rest of today and you should NOT DRIVE or use heavy machinery until tomorrow (because of the sedation medicines used during the test).  FOLLOW UP: Our staff will call the number listed on your records the next business day following your procedure.  We will call around 7:15- 8:00 am to check on you and address any questions or concerns that you may have regarding the information given to you following your procedure. If we do not reach you, we will leave a message.     If any biopsies were taken you will be contacted by phone or by letter within the next 1-3 weeks.  Please call us at [phone] if you have not heard about the biopsies in 3 weeks.    SIGNATURES/CONFIDENTIALITY: You and/or your care partner have signed paperwork which will be entered into your electronic medical record.  These signatures attest to the fact that that the information above on your After Visit Summary has been reviewed and is understood.  Full responsibility of the confidentiality of this discharge information lies with you and/or your care-partner.

## 2023-03-11 NOTE — Progress Notes (Unsigned)
Uneventful anesthetic. Report to pacu rn. Vss. Care resumed by rn. 

## 2023-03-11 NOTE — Progress Notes (Unsigned)
VS completed by DT.  Pt's states no medical or surgical changes since previsit or office visit.  

## 2023-03-12 ENCOUNTER — Telehealth: Payer: Self-pay | Admitting: *Deleted

## 2023-03-12 NOTE — Telephone Encounter (Signed)
  Follow up Call-     03/11/2023   11:15 AM  Call back number  Post procedure Call Back phone  # [phone]  Permission to leave phone message Yes     Patient questions:  Do you have a fever, pain , or abdominal swelling? No. Pain Score  0 *  Have you tolerated food without any problems? Yes.    Have you been able to return to your normal activities? Yes.    Do you have any questions about your discharge instructions: Diet   No. Medications  No. Follow up visit  No.  Do you have questions or concerns about your Care? No.  Actions: * If pain score is 4 or above: No action needed, pain <4.

## 2023-03-15 ENCOUNTER — Encounter: Payer: Self-pay | Admitting: Internal Medicine

## 2023-10-24 ENCOUNTER — Ambulatory Visit: Payer: 59 | Admitting: Primary Care

## 2023-10-29 ENCOUNTER — Other Ambulatory Visit: Payer: Self-pay | Admitting: Primary Care

## 2023-10-29 ENCOUNTER — Ambulatory Visit: Payer: 59 | Admitting: Primary Care

## 2023-10-29 ENCOUNTER — Other Ambulatory Visit: Payer: Self-pay

## 2023-10-29 ENCOUNTER — Encounter: Payer: Self-pay | Admitting: Primary Care

## 2023-10-29 VITALS — BP 138/86 | HR 66 | Temp 97.2°F | Ht 66.0 in | Wt 198.0 lb

## 2023-10-29 DIAGNOSIS — E039 Hypothyroidism, unspecified: Secondary | ICD-10-CM

## 2023-10-29 DIAGNOSIS — E079 Disorder of thyroid, unspecified: Secondary | ICD-10-CM | POA: Diagnosis not present

## 2023-10-29 DIAGNOSIS — Z23 Encounter for immunization: Secondary | ICD-10-CM | POA: Diagnosis not present

## 2023-10-29 DIAGNOSIS — F411 Generalized anxiety disorder: Secondary | ICD-10-CM

## 2023-10-29 DIAGNOSIS — N939 Abnormal uterine and vaginal bleeding, unspecified: Secondary | ICD-10-CM

## 2023-10-29 LAB — COMPREHENSIVE METABOLIC PANEL
ALT: 12 U/L (ref 0–35)
AST: 17 U/L (ref 0–37)
Albumin: 4.4 g/dL (ref 3.5–5.2)
Alkaline Phosphatase: 73 U/L (ref 39–117)
BUN: 11 mg/dL (ref 6–23)
CO2: 28 meq/L (ref 19–32)
Calcium: 9.2 mg/dL (ref 8.4–10.5)
Chloride: 102 meq/L (ref 96–112)
Creatinine, Ser: 0.9 mg/dL (ref 0.40–1.20)
GFR: 74.2 mL/min (ref >60.00)
Glucose, Bld: 82 mg/dL (ref 70–99)
Potassium: 3.9 meq/L (ref 3.5–5.1)
Sodium: 138 meq/L (ref 135–145)
Total Bilirubin: 0.4 mg/dL (ref 0.2–1.2)
Total Protein: 6.9 g/dL (ref 6.0–8.3)

## 2023-10-29 LAB — CBC
HCT: 36.9 % (ref 36.0–46.0)
Hemoglobin: 11.8 g/dL — ABNORMAL LOW (ref 12.0–15.0)
MCHC: 32.1 g/dL (ref 30.0–36.0)
MCV: 78.3 fL (ref 78.0–100.0)
Platelets: 269 10*3/uL (ref 150.0–400.0)
RBC: 4.72 Mil/uL (ref 3.87–5.11)
RDW: 16.9 % — ABNORMAL HIGH (ref 11.5–15.5)
WBC: 7.7 10*3/uL (ref 4.0–10.5)

## 2023-10-29 LAB — HEMOGLOBIN A1C: Hgb A1c MFr Bld: 5.4 % (ref 4.6–6.5)

## 2023-10-29 LAB — TSH: TSH: 31.5 u[IU]/mL — ABNORMAL HIGH (ref 0.35–5.50)

## 2023-10-29 MED ORDER — SYNTHROID 125 MCG PO TABS
125.0000 ug | ORAL_TABLET | Freq: Every morning | ORAL | 0 refills | Status: DC
  Filled 2023-10-29: qty 90, 90d supply, fill #0

## 2023-10-29 NOTE — Assessment & Plan Note (Addendum)
 Symptoms today suggestive of hypothyroidism, especially with lack of hormone replacement.  Repeat TSH pending which is likely uncontrolled.  Long discussion about the need for thyroid  hormone replacement in someone with hypothyroidism. Will anticipate resuming T4 treatment.  Will try to switch to brand Synthroid  as it seems that she may have felt better on this regimen. Await lab results.

## 2023-10-29 NOTE — Assessment & Plan Note (Signed)
 Controlled.  Continue to monitor.

## 2023-10-29 NOTE — Patient Instructions (Signed)
 Stop by the lab prior to leaving today. I will notify you of your results once received.   Schedule a nurse visit in 2 to 6 months for your second shingles shot.  It was a pleasure to see you today!

## 2023-10-29 NOTE — Assessment & Plan Note (Signed)
 Improved.  Remain off OCPs.

## 2023-10-29 NOTE — Progress Notes (Signed)
 Subjective:    Patient ID: Aimee Cole, female    DOB: [birth year], 53 y.o.   MRN: [id number]  HPI  Aimee Cole is a very pleasant 53 y.o. female with a history of hypothyroidism, GAD, abnormal uterine bleeding who presents today for follow-up of chronic conditions.  She is also due for her first shingles shot.  1) Hypothyroidism: Currently managed on levothyroxine  125 mcg tablets daily.  She stopped levothyroxine  and birth control pills about 2 months ago due to symptoms of shortness of breath, fatigue, palpitations, bloating, constipation. Symptoms have resolved except for bloating, constipation, and weight gain.   She is due for repeat TSH today. She switched from brand Synthroid  to levothyroxine  about 1 year ago as insurance would not cover brand.   She's also frustrated about her weight gain over the last year. She is perimenopausal, menstrual cycles have improved, less heavy.   Wt Readings from Last 3 Encounters:  10/29/23 198 lb (89.8 kg)  03/11/23 180 lb (81.6 kg)  02/18/23 180 lb (81.6 kg)     2) GAD: Chronic history. Previously managed on venlafaxine  ER 37.5 mg. She has not taken this in >1 year as she felt that anxiety was well managed.   Today she denies feeling anxious, feeling overwhelmed. She has noticed difficulty focusing, doesn't feel like herself.   Review of Systems  Constitutional:  Positive for fatigue.  Respiratory:  Negative for shortness of breath.   Cardiovascular:  Negative for chest pain.  Gastrointestinal:  Positive for constipation.  Genitourinary:  Negative for menstrual problem.  Psychiatric/Behavioral:  The patient is not nervous/anxious.          Past Medical History:  Diagnosis Date   Anemia    Thyroid  disease     Social History   Socioeconomic History   Marital status: Married    Spouse name: Not on file   Number of children: Not on file   Years of education: Not on file   Highest education level: Not on file   Occupational History   Not on file  Tobacco Use   Smoking status: Former   Smokeless tobacco: Never  Vaping Use   Vaping status: Never Used  Substance and Sexual Activity   Alcohol use: No    Alcohol/week: 0.0 standard drinks of alcohol   Drug use: No   Sexual activity: Yes    Birth control/protection: Pill  Other Topics Concern   Not on file  Social History Narrative   Not on file   Social Drivers of Health   Financial Resource Strain: Not on file  Food Insecurity: Not on file  Transportation Needs: Not on file  Physical Activity: Not on file  Stress: Not on file  Social Connections: Not on file  Intimate Partner Violence: Not on file    Past Surgical History:  Procedure Laterality Date   WISDOM TOOTH EXTRACTION      Family History  Problem Relation Age of Onset   Uterine cancer Mother    Hypertension Father    Hyperlipidemia Father    Heart disease Father    Breast cancer Maternal Aunt    Pancreatic cancer Maternal Grandmother    Colon cancer Maternal Grandfather    Diabetes Paternal Grandfather    Rectal cancer Neg Hx    Stomach cancer Neg Hx    Colon polyps Neg Hx    Esophageal cancer Neg Hx     No Known Allergies  Current Outpatient Medications on File Prior to  Visit  Medication Sig Dispense Refill   Multiple Vitamin (MULTIVITAMIN) tablet Take 1 tablet by mouth daily.     levothyroxine  (SYNTHROID ) 125 MCG tablet Take 1 tablet by mouth every morning on an empty stomach with water only.  No food or other medications for 30 minutes. (Patient not taking: Reported on 10/29/2023) 90 tablet 3   norgestimate -ethinyl estradiol  (SPRINTEC  28) 0.25-35 MG-MCG tablet Take 1 tablet by mouth daily. (Patient not taking: Reported on 10/29/2023) 84 tablet 3   No current facility-administered medications on file prior to visit.    BP 138/86   Pulse 66   Temp (!) 97.2 F (36.2 C) (Temporal)   Ht 5' 6 (1.676 m)   Wt 198 lb (89.8 kg)   SpO2 99%   BMI 31.96 kg/m   Objective:   Physical Exam Cardiovascular:     Rate and Rhythm: Normal rate and regular rhythm.  Pulmonary:     Effort: Pulmonary effort is normal.     Breath sounds: Normal breath sounds.  Musculoskeletal:     Cervical back: Neck supple.  Skin:    General: Skin is warm and dry.  Neurological:     Mental Status: She is alert and oriented to person, place, and time.  Psychiatric:        Mood and Affect: Mood normal.           Assessment & Plan:  Hypothyroidism, unspecified type Assessment & Plan: Symptoms today suggestive of hypothyroidism, especially with lack of hormone replacement.  Repeat TSH pending which is likely uncontrolled.  Long discussion about the need for thyroid  hormone replacement in someone with hypothyroidism. Will anticipate resuming T4 treatment.  Will try to switch to brand Synthroid  as it seems that she may have felt better on this regimen. Await lab results.  Orders: -     TSH -     Hemoglobin A1c -     Comprehensive metabolic panel -     CBC  Disorder of thyroid  gland Assessment & Plan: Symptoms today suggestive of hypothyroidism, especially with lack of hormone replacement.  Repeat TSH pending which is likely uncontrolled.  Long discussion about the need for thyroid  hormone replacement in someone with hypothyroidism. Will anticipate resuming T4 treatment.  Will try to switch to brand Synthroid  as it seems that she may have felt better on this regimen. Await lab results.   Abnormal uterine bleeding Assessment & Plan: Improved.  Remain off OCPs.   Generalized anxiety disorder Assessment & Plan: Controlled.  Continue to monitor.   Encounter for immunization -     Varicella-zoster vaccine IM        Tanaja Ganger K Hiliary Osorto, NP

## 2023-11-05 ENCOUNTER — Other Ambulatory Visit: Payer: Self-pay

## 2023-11-29 ENCOUNTER — Other Ambulatory Visit: Payer: Self-pay

## 2023-11-29 DIAGNOSIS — N920 Excessive and frequent menstruation with regular cycle: Secondary | ICD-10-CM | POA: Diagnosis not present

## 2023-11-29 DIAGNOSIS — Z6831 Body mass index (BMI) 31.0-31.9, adult: Secondary | ICD-10-CM | POA: Diagnosis not present

## 2023-11-29 DIAGNOSIS — D649 Anemia, unspecified: Secondary | ICD-10-CM | POA: Diagnosis not present

## 2023-11-29 DIAGNOSIS — Z130 Encounter for screening for diseases of the blood and blood-forming organs and certain disorders involving the immune mechanism: Secondary | ICD-10-CM | POA: Diagnosis not present

## 2023-11-29 DIAGNOSIS — Z124 Encounter for screening for malignant neoplasm of cervix: Secondary | ICD-10-CM | POA: Diagnosis not present

## 2023-11-29 DIAGNOSIS — Z1151 Encounter for screening for human papillomavirus (HPV): Secondary | ICD-10-CM | POA: Diagnosis not present

## 2023-11-29 DIAGNOSIS — E039 Hypothyroidism, unspecified: Secondary | ICD-10-CM | POA: Diagnosis not present

## 2023-11-29 DIAGNOSIS — Z13228 Encounter for screening for other metabolic disorders: Secondary | ICD-10-CM | POA: Diagnosis not present

## 2023-11-29 DIAGNOSIS — Z1231 Encounter for screening mammogram for malignant neoplasm of breast: Secondary | ICD-10-CM | POA: Diagnosis not present

## 2023-11-29 DIAGNOSIS — Z01419 Encounter for gynecological examination (general) (routine) without abnormal findings: Secondary | ICD-10-CM | POA: Diagnosis not present

## 2023-11-29 MED ORDER — NORGESTIMATE-ETH ESTRADIOL 0.25-35 MG-MCG PO TABS
1.0000 | ORAL_TABLET | Freq: Every day | ORAL | 3 refills | Status: AC
  Filled 2023-11-29 – 2023-12-20 (×3): qty 84, 84d supply, fill #0
  Filled 2024-06-16: qty 84, 84d supply, fill #1
  Filled 2024-08-19 – 2024-09-02 (×4): qty 84, 84d supply, fill #2

## 2023-12-10 ENCOUNTER — Other Ambulatory Visit: Payer: Self-pay

## 2023-12-19 ENCOUNTER — Other Ambulatory Visit: Payer: Self-pay

## 2023-12-19 DIAGNOSIS — E039 Hypothyroidism, unspecified: Secondary | ICD-10-CM | POA: Diagnosis not present

## 2023-12-19 MED ORDER — LIOTHYRONINE SODIUM 5 MCG PO TABS
5.0000 ug | ORAL_TABLET | Freq: Every day | ORAL | 0 refills | Status: AC
  Filled 2023-12-19 (×2): qty 90, 90d supply, fill #0

## 2023-12-20 ENCOUNTER — Other Ambulatory Visit: Payer: Self-pay

## 2024-01-10 DIAGNOSIS — E039 Hypothyroidism, unspecified: Secondary | ICD-10-CM | POA: Diagnosis not present

## 2024-01-10 DIAGNOSIS — D649 Anemia, unspecified: Secondary | ICD-10-CM | POA: Diagnosis not present

## 2024-01-10 DIAGNOSIS — N920 Excessive and frequent menstruation with regular cycle: Secondary | ICD-10-CM | POA: Diagnosis not present

## 2024-01-30 ENCOUNTER — Other Ambulatory Visit: Payer: Self-pay

## 2024-01-30 DIAGNOSIS — E039 Hypothyroidism, unspecified: Secondary | ICD-10-CM | POA: Diagnosis not present

## 2024-01-30 MED ORDER — LEVOTHYROXINE SODIUM 125 MCG PO TABS
125.0000 ug | ORAL_TABLET | Freq: Every morning | ORAL | 1 refills | Status: DC
  Filled 2024-01-30: qty 90, 90d supply, fill #0
  Filled 2024-05-04: qty 90, 90d supply, fill #1

## 2024-02-18 ENCOUNTER — Ambulatory Visit: Payer: 59

## 2024-02-18 NOTE — Progress Notes (Unsigned)
 Per orders of Aneta Bar, DPN AGNP-C, injection of Shingles  given by Rona Cobia in {Left/right:33004} deltoid. Patient tolerated injection well.

## 2024-02-19 ENCOUNTER — Ambulatory Visit (INDEPENDENT_AMBULATORY_CARE_PROVIDER_SITE_OTHER)

## 2024-02-19 DIAGNOSIS — Z23 Encounter for immunization: Secondary | ICD-10-CM | POA: Diagnosis not present

## 2024-05-04 ENCOUNTER — Other Ambulatory Visit: Payer: Self-pay

## 2024-07-28 ENCOUNTER — Other Ambulatory Visit: Payer: Self-pay

## 2024-07-28 MED ORDER — LEVOTHYROXINE SODIUM 125 MCG PO TABS
125.0000 ug | ORAL_TABLET | Freq: Every day | ORAL | 1 refills | Status: AC
  Filled 2024-07-28 – 2024-09-02 (×5): qty 90, 90d supply, fill #0

## 2024-08-10 ENCOUNTER — Other Ambulatory Visit: Payer: Self-pay

## 2024-08-19 ENCOUNTER — Other Ambulatory Visit: Payer: Self-pay

## 2024-08-19 ENCOUNTER — Other Ambulatory Visit (HOSPITAL_COMMUNITY): Payer: Self-pay

## 2024-08-20 ENCOUNTER — Encounter: Payer: Self-pay | Admitting: Pharmacist

## 2024-08-20 ENCOUNTER — Other Ambulatory Visit: Payer: Self-pay

## 2024-08-25 ENCOUNTER — Other Ambulatory Visit: Payer: Self-pay

## 2024-09-02 ENCOUNTER — Other Ambulatory Visit (HOSPITAL_COMMUNITY): Payer: Self-pay

## 2024-09-02 ENCOUNTER — Other Ambulatory Visit: Payer: Self-pay
